# Patient Record
Sex: Female | Born: 1970 | Race: Black or African American | Hispanic: No | Marital: Single | Smoking: Never smoker
Health system: Southern US, Community
[De-identification: ages and names within clinical notes are randomized; demographics above are authoritative.]

## PROBLEM LIST (undated history)

## (undated) DIAGNOSIS — E785 Hyperlipidemia, unspecified: Secondary | ICD-10-CM

## (undated) HISTORY — PX: BREAST CYST ASPIRATION: SHX578

## (undated) HISTORY — DX: Hyperlipidemia, unspecified: E78.5

---
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
## 2004-09-19 ENCOUNTER — Emergency Department: Payer: Self-pay | Admitting: Emergency Medicine

## 2005-07-01 ENCOUNTER — Emergency Department: Payer: Self-pay | Admitting: Emergency Medicine

## 2006-02-03 ENCOUNTER — Emergency Department: Payer: Self-pay | Admitting: Emergency Medicine

## 2006-03-07 ENCOUNTER — Ambulatory Visit: Payer: Self-pay

## 2006-08-06 ENCOUNTER — Emergency Department: Payer: Self-pay | Admitting: Unknown Physician Specialty

## 2006-12-24 ENCOUNTER — Emergency Department: Payer: Self-pay | Admitting: Unknown Physician Specialty

## 2007-02-07 ENCOUNTER — Emergency Department: Payer: Self-pay | Admitting: Emergency Medicine

## 2007-12-06 HISTORY — PX: FOOT SURGERY: SHX648

## 2008-03-10 ENCOUNTER — Emergency Department: Payer: Self-pay | Admitting: Emergency Medicine

## 2008-07-25 ENCOUNTER — Emergency Department: Payer: Self-pay | Admitting: Emergency Medicine

## 2009-12-05 HISTORY — PX: WISDOM TOOTH EXTRACTION: SHX21

## 2009-12-23 ENCOUNTER — Emergency Department: Payer: Self-pay | Admitting: Emergency Medicine

## 2010-01-11 ENCOUNTER — Emergency Department: Payer: Self-pay | Admitting: Emergency Medicine

## 2010-01-13 ENCOUNTER — Emergency Department: Payer: Self-pay | Admitting: Emergency Medicine

## 2011-01-12 ENCOUNTER — Emergency Department: Payer: Self-pay | Admitting: Emergency Medicine

## 2011-06-11 ENCOUNTER — Emergency Department: Payer: Self-pay | Admitting: Emergency Medicine

## 2011-08-10 LAB — HM HIV SCREENING LAB: HM HIV Screening: NEGATIVE

## 2014-09-04 ENCOUNTER — Ambulatory Visit: Payer: Self-pay | Admitting: Family Medicine

## 2014-09-09 ENCOUNTER — Encounter: Payer: Self-pay | Admitting: General Surgery

## 2014-09-09 ENCOUNTER — Other Ambulatory Visit: Payer: BC Managed Care – PPO

## 2014-09-09 ENCOUNTER — Ambulatory Visit (INDEPENDENT_AMBULATORY_CARE_PROVIDER_SITE_OTHER): Payer: BC Managed Care – PPO | Admitting: General Surgery

## 2014-09-09 VITALS — BP 144/72 | HR 76 | Resp 12 | Ht 63.0 in | Wt 166.0 lb

## 2014-09-09 DIAGNOSIS — N6310 Unspecified lump in the right breast, unspecified quadrant: Secondary | ICD-10-CM

## 2014-09-09 DIAGNOSIS — N63 Unspecified lump in breast: Secondary | ICD-10-CM

## 2014-09-09 DIAGNOSIS — N6009 Solitary cyst of unspecified breast: Secondary | ICD-10-CM | POA: Insufficient documentation

## 2014-09-09 DIAGNOSIS — N6001 Solitary cyst of right breast: Secondary | ICD-10-CM

## 2014-09-09 NOTE — Progress Notes (Addendum)
Patient ID: Cynthia Woods Mckenny, female   DOB: 08/16/1971, 43 y.o.   MRN: [id number]  Chief Complaint  Patient presents with  . Other    right breast mass    HPI Cynthia Woods Mastel is a 43 y.o. female who presents for an evaluation of a right breast mass. Her most recent mammogram was done on 09/04/14. The patient states she noticed the lump last Monday 09/01/14,  having noted some soreness in the right breast approximately at the 11 o'clock position. She states the area was hard to the touch, and tender with direct pressure. She had not experienced any difficulty with perimenstrual pain or swelling in the past (the nodular area was appreciated on the last day of her menses and described as being the size of a ping-pong ball.  She does not perform self breast checks. Her recent mammogram was her first.   No personal or family history of breast problems. No injuries to the breast.   HPI  No past medical history on file.  Past Surgical History  Procedure Laterality Date  . Foot surgery  2009  . Wisdom tooth extraction  2011  . Gynecologic cryosurgery  1993    Family History  Problem Relation Age of Onset  . Other Mother     brain tumor  . Cancer Paternal Grandfather     colon  . Other Maternal Aunt     brain tumor  . Other Maternal Grandmother     brain tumor    Social History History  Substance Use Topics  . Smoking status: Never Smoker   . Smokeless tobacco: Never Used  . Alcohol Use: Yes    No Known Allergies  Current Outpatient Prescriptions  Medication Sig Dispense Refill  . VIORELE 0.15-0.02/0.01 MG (21/5) tablet Take 1 tablet by mouth daily.        No current facility-administered medications for this visit.    Review of Systems Review of Systems  Constitutional: Negative.   Respiratory: Negative.   Cardiovascular: Negative.     Blood pressure 144/72, pulse 76, resp. rate 12, height 5\' 3"  (1.6 m), weight 166 lb (75.297 kg), last menstrual period  08/30/2014.  Physical Exam Physical Exam  Constitutional: She is oriented to person, place, and time. She appears well-developed and well-nourished.  Neck: Neck supple. No thyromegaly present.  Cardiovascular: Normal rate, regular rhythm and normal heart sounds.   No murmur heard. Pulmonary/Chest: Effort normal and breath sounds normal. Right breast exhibits no inverted nipple, no mass, no nipple discharge, no skin change and no tenderness. Left breast exhibits no inverted nipple, no mass, no nipple discharge, no skin change and no tenderness.    Slight thickening upper outer quadrant.  Lymphadenopathy:    She has no cervical adenopathy.    She has no axillary adenopathy.  Neurological: She is alert and oriented to person, place, and time.  Skin: Skin is warm and dry.    Data Reviewed PCP notes of 09/02/2014 where a 3 x 4 cm breast mass is described.   Bilateral diagnostic mammograms and ultrasound dated 09/04/2014 were reviewed. No mammographic normality is appreciated in either breast.  Ultrasound of the upper outer quadrant of the right breast at the area of palpable thickening identified a 1 x 1 x 0.8 cm cystic lesion with minimal multiple internal layering debris but no solid component. Screening mammogram in one year recommended. BI-RAD-2.  Ultrasound examination of the right breast 9 to 12:00 position showed multiple cysts, largest at the  9:00 position (images mislabeled as three o'clock) 2 cm from nipple measuring 0.6 x 1.4 x 1.4 cm. At the 11:00 position multiple small cysts measuring up to 0.7 cm were noted. At the 10:00 position a simple cyst measuring 0.5 x 0.7 x 0.9 cm is identified. At the 10:00 position, 7 cm from nipple in the area of palpable thickening a simple cyst measuring less than 0.5 cm is noted as well as the index lesion measuring 0.7 x 1.06 x 1.07 cm. This has a hypoechoic heterogeneous central area and suggestion of a thickened rim.BIRAD 3.   The patient was  amenable to aspiration which was completed using 1 cc of 1% plain Xylocaine. 1 cc of purulent material was obtained, culture sent for aerobic organisms. The cystic component resolved but the hypoechoic rim persisted suggestive of an inflammatory process. The procedure was well tolerated.  Assessment    Traumatic versus infected right breast cyst.    Plan    The patient is essentially asymptomatic at this time, reporting some decrease in size prior to today's evaluation. Considering the prolonged appearance, culture was obtained. She denies any nipple or contact. No history of trauma. We'll plan for reassessment in 4 weeks to confirm complete resolution. If her examination is negative at that time, annual screening mammograms and yearly PCP exam to be appropriate      PCP/Ref MD: Dr. Margo Aye, Merrily Pew 09/09/2014, 10:58 AM

## 2014-09-09 NOTE — Patient Instructions (Signed)
Patient to return in 1 month for follow up. Continue self breast exams. Call office for any new breast issues or concerns.  

## 2014-09-11 LAB — AEROBIC CULTURE

## 2014-09-15 ENCOUNTER — Telehealth: Payer: Self-pay | Admitting: *Deleted

## 2014-09-15 NOTE — Telephone Encounter (Signed)
Pt was just calling to get her results on her culture that she has on 09/09/14

## 2014-09-16 NOTE — Telephone Encounter (Signed)
The patient was notified of the culture results were negative. She reports tolerating the aspiration well. She has not appreciated any recurrence of the breast nodule. Plans are for followup in 3-to 4 weeks for final assessment.

## 2014-10-06 ENCOUNTER — Encounter: Payer: Self-pay | Admitting: General Surgery

## 2014-10-14 ENCOUNTER — Ambulatory Visit: Payer: BC Managed Care – PPO | Admitting: General Surgery

## 2014-10-23 ENCOUNTER — Encounter: Payer: Self-pay | Admitting: *Deleted

## 2015-05-18 LAB — HM PAP SMEAR
HM PAP: NEGATIVE
HM Pap smear: NEGATIVE

## 2017-06-23 ENCOUNTER — Other Ambulatory Visit: Payer: Self-pay | Admitting: Orthopedic Surgery

## 2017-06-23 DIAGNOSIS — M5412 Radiculopathy, cervical region: Secondary | ICD-10-CM

## 2017-06-23 DIAGNOSIS — S161XXD Strain of muscle, fascia and tendon at neck level, subsequent encounter: Secondary | ICD-10-CM

## 2017-06-23 DIAGNOSIS — S143XXA Injury of brachial plexus, initial encounter: Secondary | ICD-10-CM

## 2017-07-07 ENCOUNTER — Other Ambulatory Visit: Payer: Self-pay

## 2017-07-20 DIAGNOSIS — M5412 Radiculopathy, cervical region: Secondary | ICD-10-CM | POA: Insufficient documentation

## 2017-09-20 ENCOUNTER — Telehealth: Payer: Self-pay | Admitting: Family Medicine

## 2017-09-20 NOTE — Telephone Encounter (Signed)
ERRENOUS °

## 2017-09-27 ENCOUNTER — Encounter: Payer: Self-pay | Admitting: Family Medicine

## 2017-09-27 ENCOUNTER — Ambulatory Visit (INDEPENDENT_AMBULATORY_CARE_PROVIDER_SITE_OTHER): Payer: Self-pay | Admitting: Family Medicine

## 2017-09-27 VITALS — BP 110/70 | HR 86 | Resp 14 | Ht 63.0 in | Wt 183.3 lb

## 2017-09-27 DIAGNOSIS — R768 Other specified abnormal immunological findings in serum: Secondary | ICD-10-CM

## 2017-09-27 DIAGNOSIS — R5383 Other fatigue: Secondary | ICD-10-CM

## 2017-09-27 DIAGNOSIS — E559 Vitamin D deficiency, unspecified: Secondary | ICD-10-CM

## 2017-09-27 NOTE — Progress Notes (Signed)
Name: Cynthia Woods   MRN: [id number]    DOB: 07-05-1971   Date:09/27/2017       Progress Note  Subjective  Chief Complaint  Chief Complaint  Patient presents with  . Advice Only    feels down.    HPI  Fatigue/positive hepatitis C screen: she was under [employer] for 3 weeks back in August and while donating plasma she had a positive hepatitis C test. She kept it all to herself for months but this week she decided to tell her sister, her supervisor and her children. She states she has not been depressed, but last week she started to get very worried, feeling better since she talked to her children. She is scared and wants to be treated, but will only have insurance in January. She states she can have labs done since it is free through [facility].    Patient Active Problem List   Diagnosis Date Noted  . Cervical radiculopathy 07/20/2017  . Breast mass, right 09/09/2014  . Benign breast cyst in female 09/09/2014    Past Surgical History:  Procedure Laterality Date  . FOOT SURGERY  2009  . GYNECOLOGIC CRYOSURGERY  1993  . WISDOM TOOTH EXTRACTION  2011    Family History  Problem Relation Age of Onset  . Other Mother        brain tumor  . Cancer Paternal Grandfather        colon  . Other Maternal Aunt        brain tumor  . Other Maternal Grandmother        brain tumor    Social History   Social History  . Marital status: Single    Spouse name: N/A  . Number of children: N/A  . Years of education: N/A   Occupational History  . Not on file.   Social History Main Topics  . Smoking status: Never Smoker  . Smokeless tobacco: Never Used  . Alcohol use Yes  . Drug use: No  . Sexual activity: Not Currently   Other Topics Concern  . Not on file   Social History Narrative   She works at [employer]   She has 3 children at home   She was on [employer] for 3 weeks in August 2018, back to work full time now     Current Outpatient Prescriptions:  Marland Kitchen   VIORELE 0.15-0.02/0.01 MG (21/5) tablet, Take 1 tablet by mouth daily. , Disp: , Rfl:   Allergies  Allergen Reactions  . Naproxen Diarrhea     ROS  Constitutional: Negative for fever or weight change.  Respiratory: Negative for cough and shortness of breath.   Cardiovascular: Negative for chest pain or palpitations.  Gastrointestinal: Negative for abdominal pain, no bowel changes.  Musculoskeletal: Negative for gait problem or joint swelling. She has right shoulder pain and some knee pain - chronic Skin: Negative for rash.  Neurological: Negative for dizziness or headache.  No other specific complaints in a complete review of systems (except as listed in HPI above).   Objective  Vitals:   09/27/17 0750  BP: 110/70  Pulse: 86  Resp: 14  SpO2: 98%  Weight: 183 lb 4.8 oz (83.1 kg)  Height: 5\' 3"  (1.6 m)    Body mass index is 32.47 kg/m.  Physical Exam  Constitutional: Patient appears well-developed and well-nourished. Obese No distress.  HEENT: head atraumatic, normocephalic, pupils equal and reactive to light,  neck supple, throat within normal limits Cardiovascular: Normal  rate, regular rhythm and normal heart sounds.  No murmur heard. No BLE edema. Pulmonary/Chest: Effort normal and breath sounds normal. No respiratory distress. Abdominal: Soft.  There is no tenderness. Normal bowel sounds, no masses or hepatomegaly  Psychiatric: Patient has a normal mood and affect. behavior is normal. Judgment and thought content normal.  PHQ2/9: Depression screen PHQ 2/9 09/27/2017  Decreased Interest 0  Down, Depressed, Hopeless 0  PHQ - 2 Score 0     Fall Risk: Fall Risk  09/27/2017 09/27/2017  Falls in the past year? Yes No  Number falls in past yr: 1 -  Injury with Fall? Yes -  Follow up Education provided -     Assessment & Plan  1. Other fatigue  - CBC with Differential/Platelet - Comprehensive metabolic panel - Vitamin B12 - VITAMIN D 25 Hydroxy (Vit-D  Deficiency, Fractures) - TSH - Lipid panel - Hemoglobin A1c - Hepatitis, Acute - Hepatitis c antibody (reflex)  2. Hepatitis C antibody test positive  - CBC with Differential/Platelet - Comprehensive metabolic panel - Vitamin B12 - VITAMIN D 25 Hydroxy (Vit-D Deficiency, Fractures) - TSH - Lipid panel - Hemoglobin A1c - Hepatitis, Acute - Hepatitis c antibody (reflex)

## 2017-09-27 NOTE — Patient Instructions (Addendum)
Hepatitis C Hepatitis C is a viral infection of the liver. It can lead to scarring of the liver (cirrhosis), liver failure, or liver cancer. Hepatitis C may go undetected for months or years because people with the infection may not have symptoms, or they may have only mild symptoms. What are the causes? Hepatitis C is caused by the hepatitis C virus (HCV). The virus can be passed from one person to another through:  Blood.  Contaminated needles, such as those used for tattooing, body piercing, acupuncture, or injecting drugs.  Having unprotected sex with an infected person.  Childbirth.  Blood transfusions or organ transplants done in the [place] before 1992.  What increases the risk? Risk factors for hepatitis C include:  Having unprotected sex with an infected person.  Using illegal drugs.  What are the signs or symptoms? Symptoms of hepatitis C may include:  Fatigue.  Loss of appetite.  Nausea.  Vomiting.  Abdominal pain.  Dark yellow urine.  Yellowish skin and eyes (jaundice).  Itching of the skin.  Clay-colored bowel movements.  Joint pain.  Symptoms are not always present. How is this diagnosed? Hepatitis C is diagnosed with blood tests. Other types of tests may also be done to check how your liver is functioning. How is this treated? Your health care provider may perform noninvasive tests or a liver biopsy to help determine the best course of treatment. Treatment for hepatitis C may include one or more medicines. Your health care provider may check you for a recurring infection or other liver conditions every 6-12 months after treatment. Follow these instructions at home:  Rest as needed.  Take all medicines as directed by your health care provider.  Do not take any medicine unless approved by your health care provider. This includes over-the-counter medicine and birth control pills.  Do not drink alcohol.  Do not have sex until approved by  your health care provider.  Do not share toothbrushes, nail clippers, razors, or needles. How is this prevented? There is no vaccine for hepatitis C. The only way to prevent the disease is to reduce the risk of exposure to the virus. This may be done by:  Practicing safe sex and using condoms.  Avoiding illegal drugs.  Contact a health care provider if:  You have a fever.  You develop abdominal pain.  You develop dark urine.  You have clay-colored bowel movements.  You develop joint pains. Get help right away if:  You have increasing fatigue or weakness.  You lose your appetite.  You feel nauseous or vomit.  You develop jaundice or your jaundice gets worse.  You bruise or bleed easily. This information is not intended to replace advice given to you by your health care provider. Make sure you discuss any questions you have with your health care provider. Document Released: 11/18/2000 Document Revised: 04/28/2016 Document Reviewed: 03/05/2014 Elsevier Interactive Patient Education  2017 [employer]  Check CDC website

## 2017-09-29 ENCOUNTER — Other Ambulatory Visit: Payer: Self-pay | Admitting: Family Medicine

## 2017-09-29 DIAGNOSIS — R768 Other specified abnormal immunological findings in serum: Secondary | ICD-10-CM

## 2017-09-29 LAB — CBC WITH DIFFERENTIAL/PLATELET
BASOS ABS: 0 10*3/uL (ref 0.0–0.2)
Basos: 0 %
EOS (ABSOLUTE): 0 10*3/uL (ref 0.0–0.4)
Eos: 0 %
Hematocrit: 38.7 % (ref 34.0–46.6)
Hemoglobin: 12.9 g/dL (ref 11.1–15.9)
IMMATURE GRANULOCYTES: 0 %
Immature Grans (Abs): 0 10*3/uL (ref 0.0–0.1)
Lymphocytes Absolute: 1.1 10*3/uL (ref 0.7–3.1)
Lymphs: 13 %
MCH: 30.6 pg (ref 26.6–33.0)
MCHC: 33.3 g/dL (ref 31.5–35.7)
MCV: 92 fL (ref 79–97)
MONOS ABS: 0.4 10*3/uL (ref 0.1–0.9)
Monocytes: 5 %
NEUTROS PCT: 82 %
Neutrophils Absolute: 7.1 10*3/uL — ABNORMAL HIGH (ref 1.4–7.0)
PLATELETS: 250 10*3/uL (ref 150–379)
RBC: 4.21 x10E6/uL (ref 3.77–5.28)
RDW: 13.9 % (ref 12.3–15.4)
WBC: 8.7 10*3/uL (ref 3.4–10.8)

## 2017-09-29 LAB — LIPID PANEL
CHOL/HDL RATIO: 3.3 ratio (ref 0.0–4.4)
CHOLESTEROL TOTAL: 260 mg/dL — AB (ref 100–199)
HDL: 79 mg/dL (ref >39)
LDL CALC: 163 mg/dL — AB (ref 0–99)
Triglycerides: 90 mg/dL (ref 0–149)
VLDL Cholesterol Cal: 18 mg/dL (ref 5–40)

## 2017-09-29 LAB — VITAMIN B12: VITAMIN B 12: 512 pg/mL (ref 232–1245)

## 2017-09-29 LAB — HEPATITIS PANEL, ACUTE
HEP A IGM: NEGATIVE
Hep B C IgM: NEGATIVE
Hep C Virus Ab: <0.1 s/co ratio (ref 0.0–0.9)
Hepatitis B Surface Ag: NEGATIVE

## 2017-09-29 LAB — COMPREHENSIVE METABOLIC PANEL
ALK PHOS: 53 IU/L (ref 39–117)
ALT: 12 IU/L (ref 0–32)
AST: 17 IU/L (ref 0–40)
Albumin/Globulin Ratio: 1.5 (ref 1.2–2.2)
Albumin: 4 g/dL (ref 3.5–5.5)
BUN/Creatinine Ratio: 17 (ref 9–23)
BUN: 13 mg/dL (ref 6–24)
Bilirubin Total: <0.2 mg/dL (ref 0.0–1.2)
CALCIUM: 9.2 mg/dL (ref 8.7–10.2)
CO2: 19 mmol/L — AB (ref 20–29)
CREATININE: 0.78 mg/dL (ref 0.57–1.00)
Chloride: 107 mmol/L — ABNORMAL HIGH (ref 96–106)
GFR calc Af Amer: 107 mL/min/{1.73_m2} (ref >59)
GFR, EST NON AFRICAN AMERICAN: 93 mL/min/{1.73_m2} (ref >59)
Globulin, Total: 2.6 g/dL (ref 1.5–4.5)
Glucose: 117 mg/dL — ABNORMAL HIGH (ref 65–99)
POTASSIUM: 4.7 mmol/L (ref 3.5–5.2)
Sodium: 142 mmol/L (ref 134–144)
Total Protein: 6.6 g/dL (ref 6.0–8.5)

## 2017-09-29 LAB — VITAMIN D 25 HYDROXY (VIT D DEFICIENCY, FRACTURES): VIT D 25 HYDROXY: 11.2 ng/mL — AB (ref 30.0–100.0)

## 2017-09-29 LAB — HEMOGLOBIN A1C
ESTIMATED AVERAGE GLUCOSE: 105 mg/dL
HEMOGLOBIN A1C: 5.3 % (ref 4.8–5.6)

## 2017-09-29 LAB — TSH: TSH: 0.62 u[IU]/mL (ref 0.450–4.500)

## 2017-09-29 MED ORDER — VITAMIN D (ERGOCALCIFEROL) 1.25 MG (50000 UNIT) PO CAPS: 50000.0000 [IU] | ORAL_CAPSULE | ORAL | 0 refills | Status: DC

## 2017-09-29 NOTE — Addendum Note (Signed)
Addended by: Cynda FamiliaJOHNSON, Shadrach Bartunek L on: 09/29/2017 03:52 PM   Modules accepted: Orders

## 2017-10-12 ENCOUNTER — Ambulatory Visit: Payer: Self-pay | Admitting: Family Medicine

## 2017-10-24 ENCOUNTER — Ambulatory Visit: Payer: Self-pay | Admitting: Family Medicine

## 2017-11-10 ENCOUNTER — Ambulatory Visit: Payer: Self-pay | Admitting: Family Medicine

## 2017-12-19 ENCOUNTER — Other Ambulatory Visit: Payer: Self-pay | Admitting: Family Medicine

## 2017-12-19 DIAGNOSIS — E559 Vitamin D deficiency, unspecified: Secondary | ICD-10-CM

## 2017-12-19 NOTE — Telephone Encounter (Signed)
Refill request for general medication: Vitamin D 50,000 units  Last office visit: 09/27/2017  Last physical exam: None   Follow-up on file. 12/28/2017

## 2017-12-28 ENCOUNTER — Ambulatory Visit: Payer: Self-pay | Admitting: Family Medicine

## 2018-02-09 ENCOUNTER — Ambulatory Visit: Payer: Self-pay | Admitting: Family Medicine

## 2018-03-24 ENCOUNTER — Other Ambulatory Visit: Payer: Self-pay | Admitting: Family Medicine

## 2018-03-24 DIAGNOSIS — E559 Vitamin D deficiency, unspecified: Secondary | ICD-10-CM

## 2018-05-02 DIAGNOSIS — M25511 Pain in right shoulder: Secondary | ICD-10-CM | POA: Insufficient documentation

## 2018-07-03 ENCOUNTER — Encounter: Payer: Self-pay | Admitting: Family Medicine

## 2018-07-03 ENCOUNTER — Ambulatory Visit (INDEPENDENT_AMBULATORY_CARE_PROVIDER_SITE_OTHER): Payer: BLUE CROSS/BLUE SHIELD | Admitting: Family Medicine

## 2018-07-03 VITALS — BP 108/64 | HR 99 | Temp 98.8°F | Resp 18 | Ht 63.0 in | Wt 181.8 lb

## 2018-07-03 DIAGNOSIS — G44229 Chronic tension-type headache, not intractable: Secondary | ICD-10-CM | POA: Insufficient documentation

## 2018-07-03 DIAGNOSIS — G43009 Migraine without aura, not intractable, without status migrainosus: Secondary | ICD-10-CM | POA: Insufficient documentation

## 2018-07-03 MED ORDER — SUMATRIPTAN SUCCINATE 100 MG PO TABS: 100.0000 mg | ORAL_TABLET | ORAL | 0 refills | Status: DC | PRN

## 2018-07-03 MED ORDER — TOPIRAMATE 50 MG PO TABS: 25.0000 mg | ORAL_TABLET | Freq: Two times a day (BID) | ORAL | 0 refills | Status: DC

## 2018-07-03 NOTE — Patient Instructions (Addendum)
Migraine Headache A migraine headache is an intense, throbbing pain on one side or both sides of the head. Migraines may also cause other symptoms, such as nausea, vomiting, and sensitivity to light and noise. What are the causes? Doing or taking certain things may also trigger migraines, such as:  Alcohol.  Smoking.  Medicines, such as: ? Medicine used to treat chest pain (nitroglycerine). ? Birth control pills. ? Estrogen pills. ? Certain blood pressure medicines.  Aged cheeses, chocolate, or caffeine.  Foods or drinks that contain nitrates, glutamate, aspartame, or tyramine.  Physical activity.  Other things that may trigger a migraine include:  Menstruation.  Pregnancy.  Hunger.  Stress, lack of sleep, too much sleep, or fatigue.  Weather changes.  What increases the risk? The following factors may make you more likely to experience migraine headaches:  Age. Risk increases with age.  Family history of migraine headaches.  Being Caucasian.  Depression and anxiety.  Obesity.  Being a woman.  Having a hole in the heart (patent foramen ovale) or other heart problems.  What are the signs or symptoms? The main symptom of this condition is pulsating or throbbing pain. Pain may:  Happen in any area of the head, such as on one side or both sides.  Interfere with daily activities.  Get worse with physical activity.  Get worse with exposure to bright lights or loud noises.  Other symptoms may include:  Nausea.  Vomiting.  Dizziness.  General sensitivity to bright lights, loud noises, or smells.  Before you get a migraine, you may get warning signs that a migraine is developing (aura). An aura may include:  Seeing flashing lights or having blind spots.  Seeing bright spots, halos, or zigzag lines.  Having tunnel vision or blurred vision.  Having numbness or a tingling feeling.  Having trouble talking.  Having muscle weakness.  How is this  diagnosed? A migraine headache can be diagnosed based on:  Your symptoms.  A physical exam.  Tests, such as CT scan or MRI of the head. These imaging tests can help rule out other causes of headaches.  Taking fluid from the spine (lumbar puncture) and analyzing it (cerebrospinal fluid analysis, or CSF analysis).  How is this treated? A migraine headache is usually treated with medicines that:  Relieve pain.  Relieve nausea.  Prevent migraines from coming back.  Treatment may also include:  Acupuncture.  Lifestyle changes like avoiding foods that trigger migraines.  Follow these instructions at home: Medicines  Take over-the-counter and prescription medicines only as told by your health care provider.  Do not drive or use heavy machinery while taking prescription pain medicine.  To prevent or treat constipation while you are taking prescription pain medicine, your health care provider may recommend that you: ? Drink enough fluid to keep your urine clear or pale yellow. ? Take over-the-counter or prescription medicines. ? Eat foods that are high in fiber, such as fresh fruits and vegetables, whole grains, and beans. ? Limit foods that are high in fat and processed sugars, such as fried and sweet foods. Lifestyle  Avoid alcohol use.  Do not use any products that contain nicotine or tobacco, such as cigarettes and e-cigarettes. If you need help quitting, ask your health care provider.  Get at least 8 hours of sleep every night.  Limit your stress. General instructions   Keep a journal to find out what may trigger your migraine headaches. For example, write down: ? What you eat and   drink. ? How much sleep you get. ? Any change to your diet or medicines.  If you have a migraine: ? Avoid things that make your symptoms worse, such as bright lights. ? It may help to lie down in a dark, quiet room. ? Do not drive or use heavy machinery. ? Ask your health care provider  what activities are safe for you while you are experiencing symptoms.  Keep all follow-up visits as told by your health care provider. This is important. Contact a health care provider if:  You develop symptoms that are different or more severe than your usual migraine symptoms. Get help right away if:  Your migraine becomes severe.  You have a fever.  You have a stiff neck.  You have vision loss.  Your muscles feel weak or like you cannot control them.  You start to lose your balance often.  You develop trouble walking.  You faint. This information is not intended to replace advice given to you by your health care provider. Make sure you discuss any questions you have with your health care provider. Document Released: 11/21/2005 Document Revised: 06/10/2016 Document Reviewed: 05/09/2016 Elsevier Interactive Patient Education  2017 [employer] Inc.   Check complex regional syndrome

## 2018-07-03 NOTE — Progress Notes (Addendum)
Name: Cynthia SchillerKarrie Leshan Clear   MRN: [id number]    DOB: 12/09/1970   Date:07/03/2018       Progress Note  Subjective  Chief Complaint  Chief Complaint  Patient presents with  . Headache    Has had headaches for year but the frequency is getting worst-having them every other day. Headaches can last all day-has to take BC's or 4 Advil's to just relieve the pressure. Starts in temples and radiates down neck. Noise sensitive.    HPI  Migraine and chronic headaches: she sates that she has a long history of headache and migraines, however over the past month she has been having headaches about 4 times a week and full blown migraine about twice this month, other times she had to take medication and has to go to sleep. She is on ocp, but denies aura. She is not taking preventive medication. She states tension headache is usually temporal , dull ache, sometimes radiates to her neck, other times throbbing, intense and only resolved with BC and sleep. Associate with nausea but not vomiting.    Patient Active Problem List   Diagnosis Date Noted  . Tension headache, chronic 07/03/2018  . Migraine without aura and without status migrainosus, not intractable 07/03/2018  . Shoulder pain, right 05/02/2018  . Cervical radiculopathy 07/20/2017  . Breast mass, right 09/09/2014  . Benign breast cyst in female 09/09/2014    Past Surgical History:  Procedure Laterality Date  . FOOT SURGERY  2009  . GYNECOLOGIC CRYOSURGERY  1993  . WISDOM TOOTH EXTRACTION  2011    Family History  Problem Relation Age of Onset  . Other Mother        brain tumor  . Cancer Paternal Grandfather        colon  . Other Maternal Aunt        brain tumor  . Other Maternal Grandmother        brain tumor    Social History   Socioeconomic History  . Marital status: Single    Spouse name: Not on file  . Number of children: 3  . Years of education: Not on file  . Highest education level: Not on file  Occupational History   . Not on file  Social Needs  . Financial resource strain: Hard  . Food insecurity:    Worry: Often true    Inability: Never true  . Transportation needs:    Medical: No    Non-medical: No  Tobacco Use  . Smoking status: Never Smoker  . Smokeless tobacco: Never Used  Substance and Sexual Activity  . Alcohol use: Yes  . Drug use: No  . Sexual activity: Not Currently  Lifestyle  . Physical activity:    Days per week: 0 days    Minutes per session: 0 min  . Stress: To some extent  Relationships  . Social connections:    Talks on phone: Not on file    Gets together: Not on file    Attends religious service: Not on file    Active member of club or organization: Not on file    Attends meetings of clubs or organizations: Not on file    Relationship status: Not on file  . Intimate partner violence:    Fear of current or ex partner: No    Emotionally abused: No    Physically abused: No    Forced sexual activity: No  Other Topics Concern  . Not on file  Social History Narrative  She works at [employer]   She has 3 children at home   She was on [employer] for 3 weeks in August 2018, back to work full time now     Current Outpatient Medications:  .  cholecalciferol (VITAMIN D) 1000 units tablet, Take 1,000 Units by mouth daily., Disp: , Rfl:  .  VIORELE 0.15-0.02/0.01 MG (21/5) tablet, Take 1 tablet by mouth daily. , Disp: , Rfl:  .  SUMAtriptan (IMITREX) 100 MG tablet, Take 1 tablet (100 mg total) by mouth every 2 (two) hours as needed for migraine. May repeat in 2 hours if headache persists or recurs., Disp: 10 tablet, Rfl: 0 .  topiramate (TOPAMAX) 50 MG tablet, Take 0.5-2 tablets (25-100 mg total) by mouth 2 (two) times daily., Disp: 60 tablet, Rfl: 0  Allergies  Allergen Reactions  . Shellfish Allergy Anaphylaxis  . Naproxen Diarrhea  . Latex Rash     ROS  Ten systems reviewed and is negative except as mentioned in HPI   Objective  Vitals:   07/03/18 1400   BP: 108/64  Pulse: 99  Resp: 18  Temp: 98.8 F (37.1 C)  TempSrc: Oral  SpO2: 96%  Weight: 181 lb 12.8 oz (82.5 kg)  Height: 5\' 3"  (1.6 m)    Body mass index is 32.2 kg/m.  Physical Exam  Constitutional: Patient appears well-developed and well-nourished. Obese  No distress.  HEENT: head atraumatic, normocephalic, pupils equal and reactive to light, eneck supple, throat within normal limits Cardiovascular: Normal rate, regular rhythm and normal heart sounds.  No murmur heard. No BLE edema. Pulmonary/Chest: Effort normal and breath sounds normal. No respiratory distress. Abdominal: Soft.  There is no tenderness. Psychiatric: Patient has a normal mood and affect. behavior is normal. Judgment and thought content normal. Neurological: she is weaker on the rigth arm ( grip ) secondary neck injury at work, otherwise normal exam   PHQ2/9: Depression screen [facility] 2/9 07/03/2018 09/27/2017 09/27/2017  Decreased Interest 0 0 0  Down, Depressed, Hopeless 0 1 0  PHQ - 2 Score 0 1 0  Altered sleeping - 0 -  Tired, decreased energy - 3 -  Change in appetite - 0 -  Feeling bad or failure about yourself  - 0 -  Trouble concentrating - 0 -  Moving slowly or fidgety/restless - 0 -  Suicidal thoughts - 0 -  PHQ-9 Score - 4 -  Difficult doing work/chores - Not difficult at all -    Fall Risk: Fall Risk  07/03/2018 09/27/2017 09/27/2017  Falls in the past year? Yes Yes No  Comment May 29, 2017 - -  Number falls in past yr: 1 1 -  Injury with Fall? Yes Yes -  Comment Right Arm - -  Follow up - Education provided -     Functional Status Survey: Is the patient deaf or have difficulty hearing?: No Does the patient have difficulty seeing, even when wearing glasses/contacts?: Yes(glasses) Does the patient have difficulty concentrating, remembering, or making decisions?: No Does the patient have difficulty walking or climbing stairs?: No Does the patient have difficulty dressing or  bathing?: No Does the patient have difficulty doing errands alone such as visiting a doctor's office or shopping?: No    Assessment & Plan  1. Migraine without aura and without status migrainosus, not intractable  Discussed need to titrate medication up slowly  - SUMAtriptan (IMITREX) 100 MG tablet; Take 1 tablet (100 mg total) by mouth every 2 (two) hours as needed  for migraine. May repeat in 2 hours if headache persists or recurs.  Dispense: 10 tablet; Refill: 0 - topiramate (TOPAMAX) 50 MG tablet; Take 0.5-2 tablets (25-100 mg total) by mouth 2 (two) times daily.  Dispense: 60 tablet; Refill: 0  2. Chronic tension-type headache, not intractable  We will start with topamax, she is under more stress than usual. Oldest son not helping at home, also still going through mediations with workman's' comp, struggling financially. She is not depressed but feels stressed.

## 2018-07-04 ENCOUNTER — Encounter: Payer: Self-pay | Admitting: Family Medicine

## 2018-07-05 ENCOUNTER — Telehealth: Payer: Self-pay | Admitting: Family Medicine

## 2018-07-05 NOTE — Telephone Encounter (Signed)
Copied from CRM [phone]. Topic: Quick Communication - See Telephone Encounter >> Jul 05, 2018 11:03 AM Tamela OddiMartin, Don'Quashia, NT wrote: CRM for notification. See Telephone encounter for: 07/05/18. Patient called and states she seen Dr. Carlynn PurlSowles and she prescribed her topiramate (TOPAMAX) 50 MG tablet . She states it said take 0.5-2 tablets (25-100 mg total) by mouth 2 (two) times daily. She is unsure if she needs to take 1 whole tablet. Please call CB# [phone]

## 2018-07-05 NOTE — Telephone Encounter (Signed)
Please review the instruction and advise

## 2018-07-06 NOTE — Telephone Encounter (Signed)
I tried to contact this patient to inform her of how to properly titrate her medications but there was no answer and she did not have a voicemail where I could leave a message.   I will route this information to the P[facility].

## 2018-07-06 NOTE — Telephone Encounter (Signed)
Go up slowly on dose, start on half for a few days and go up by half every 3 days to a max of 2 in am and 2 in pm

## 2018-07-06 NOTE — Telephone Encounter (Signed)
Pt given information per Dr Carlynn PurlSowles, Go up slowly on dose, start on half for a few days and go up by half every 3 days to a max of 2 in am and 2 in pm"; she verbalizes understanding; she will try this; pt instructed to call back if she has any further questions or difficulties.

## 2018-07-18 ENCOUNTER — Telehealth: Payer: Self-pay | Admitting: Family Medicine

## 2018-07-18 NOTE — Telephone Encounter (Signed)
Copied from CRM [id number]. Topic: Referral - Request >> Jul 18, 2018  4:24 PM Maia Pettiesrtiz, Kristie S wrote: Reason for CRM: pt requesting call back to discuss neck pain and migraines. She thinks the neck pain is causing headaches and asking for a referral and/or MRI. Please advise.

## 2018-07-19 NOTE — Telephone Encounter (Signed)
I called this patient to discuss the message that was left on yesterday but she told me to just disregard it and to tell Dr. Carlynn PurlSowles "hello"

## 2018-07-19 NOTE — Telephone Encounter (Signed)
There is no indication for MRI at this time, I can refer her to neurologist

## 2018-07-19 NOTE — Telephone Encounter (Signed)
Please review and sign order for neurology referral

## 2018-08-07 ENCOUNTER — Ambulatory Visit: Payer: BLUE CROSS/BLUE SHIELD | Admitting: Family Medicine

## 2019-03-28 ENCOUNTER — Other Ambulatory Visit: Payer: Self-pay

## 2019-03-28 ENCOUNTER — Encounter: Payer: Self-pay | Admitting: Emergency Medicine

## 2019-03-28 ENCOUNTER — Encounter: Payer: Self-pay | Admitting: Family Medicine

## 2019-03-28 ENCOUNTER — Ambulatory Visit: Payer: Medicaid Other | Admitting: Family Medicine

## 2019-03-28 VITALS — BP 110/72 | HR 77 | Temp 98.4°F | Resp 16 | Ht 64.0 in | Wt 164.4 lb

## 2019-03-28 DIAGNOSIS — M109 Gout, unspecified: Secondary | ICD-10-CM | POA: Diagnosis not present

## 2019-03-28 DIAGNOSIS — G43009 Migraine without aura, not intractable, without status migrainosus: Secondary | ICD-10-CM

## 2019-03-28 MED ORDER — COLCHICINE 0.6 MG PO TABS: 0.6000 mg | ORAL_TABLET | Freq: Every day | ORAL | 0 refills | Status: DC

## 2019-03-28 NOTE — Patient Instructions (Signed)

## 2019-03-28 NOTE — Progress Notes (Signed)
Name: Cynthia Woods   MRN: [id number]    DOB: 1971-06-25   Date:03/28/2019       Progress Note  Subjective  Chief Complaint  Chief Complaint  Patient presents with  . Foot Pain    great toe on left foot. 1st noticed yesterday - no known injury. cannot bear weight - can't move it, pulsates when she is up.    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 No history of gout, no significant in her diet, however activity at work has increased. Works as a Conservation officer, nature for [employer] and since they COVID-19 they are having to walk more around the store for online orders.   Migraine: no recent episodes , she came in July 2019 with severe episode at that time episodes were about 4 times a week, she was under more stress at [facility], we gave her topamax and imitrex. She never started topamax , she was afraid of side effects, but she takes imitrex prn, no recent episodes. Episodes are described as throbbing on her temporal areas and sometimes radiates to her nuchal area. Not associated with nausea or vomiting.   Patient Active Problem List   Diagnosis Date Noted  . Tension headache, chronic 07/03/2018  . Migraine without aura and without status migrainosus, not intractable 07/03/2018  . Shoulder pain, right 05/02/2018  . Cervical radiculopathy 07/20/2017  . Breast mass, right 09/09/2014  . Benign breast cyst in female 09/09/2014    Past Surgical History:  Procedure Laterality Date  . FOOT SURGERY  2009  . GYNECOLOGIC CRYOSURGERY  1993  . WISDOM TOOTH EXTRACTION  2011    Family History  Problem Relation Age of Onset  . Other Mother        brain tumor  . Cancer Paternal Grandfather        colon  . Other Maternal Aunt        brain tumor  . Other Maternal Grandmother        brain tumor    Social History   Socioeconomic History  . Marital status:  Single    Spouse name: Not on file  . Number of children: 3  . Years of education: Not on file  . Highest education level: Not on file  Occupational History    Comment: part   Social Needs  . Financial resource strain: Hard  . Food insecurity:    Worry: Often true    Inability: Never true  . Transportation needs:    Medical: No    Non-medical: No  Tobacco Use  . Smoking status: Never Smoker  . Smokeless tobacco: Never Used  Substance and Sexual Activity  . Alcohol use: Yes  . Drug use: No  . Sexual activity: Not Currently    Partners: Male  Lifestyle  . Physical activity:    Days per week: 0 days    Minutes per session: 0 min  . Stress: To some extent  Relationships  . Social connections:    Talks on phone: More than three times a week    Gets together: Twice a week    Attends religious service: More than 4 times per year    Active member of club or organization: Yes    Attends meetings of clubs or organizations: 1 to 4 times per year    Relationship status: Never married  .  Intimate partner violence:    Fear of current or ex partner: No    Emotionally abused: No    Physically abused: No    Forced sexual activity: No  Other Topics Concern  . Not on file  Social History Narrative   She used to work at [employer] but had to quit after she settled her [employer] comp claim in August 2019    She has 3 children at home     Current Outpatient Medications:  .  cholecalciferol (VITAMIN D) 1000 units tablet, Take 1,000 Units by mouth daily., Disp: , Rfl:  .  Multiple Vitamins-Minerals (WOMENS MULTIVITAMIN PO), Take by mouth., Disp: , Rfl:  .  SUMAtriptan (IMITREX) 100 MG tablet, Take 1 tablet (100 mg total) by mouth every 2 (two) hours as needed for migraine. May repeat in 2 hours if headache persists or recurs., Disp: 10 tablet, Rfl: 0 .  VIORELE 0.15-0.02/0.01 MG (21/5) tablet, Take 1 tablet by mouth daily. , Disp: , Rfl:  .  colchicine 0.6 MG tablet, Take 1-2 tablets  (0.6-1.2 mg total) by mouth daily. Take two now  and may take one more in 2 hours if not better, may take daily for 3 days and prn, Disp: 10 tablet, Rfl: 0  Allergies  Allergen Reactions  . Shellfish Allergy Anaphylaxis  . Naproxen Diarrhea  . Latex Rash    I personally reviewed active problem list, medication list, allergies, family history with the patient/caregiver today.   ROS  Ten systems reviewed and is negative except as mentioned in HPI   Objective  Vitals:   03/28/19 0954  BP: 110/72  Pulse: 77  Resp: 16  Temp: 98.4 F (36.9 C)  TempSrc: Oral  SpO2: 97%  Weight: 164 lb 6.4 oz (74.6 kg)  Height: 5\' 4"  (1.626 m)    Body mass index is 28.22 kg/m.  Physical Exam  Constitutional: Patient appears well-developed and well-nourished. Overweight.  No distress.  HEENT: head atraumatic, normocephalic, pupils equal and reactive to light,  neck supple, throat within normal limits Cardiovascular: Normal rate, regular rhythm and normal heart sounds.  No murmur heard. No BLE edema. Pulmonary/Chest: Effort normal and breath sounds normal. No respiratory distress. Abdominal: Soft.  There is no tenderness. Psychiatric: Patient has a normal mood and affect. behavior is normal. Judgment and thought content  normal. Muscular skeletal: left first MCP swollen, slightly red and very tender to touch, not bearing weight on the area.    PHQ2/9: Depression screen [facility] 2/9 03/28/2019 07/03/2018 09/27/2017 09/27/2017  Decreased Interest 0 0 0 0  Down, Depressed, Hopeless 0 0 1 0  PHQ - 2 Score 0 0 1 0  Altered sleeping 0 - 0 -  Tired, decreased energy 0 - 3 -  Change in appetite 0 - 0 -  Feeling bad or failure about yourself  0 - 0 -  Trouble concentrating 0 - 0 -  Moving slowly or fidgety/restless 0 - 0 -  Suicidal thoughts 0 - 0 -  PHQ-9 Score 0 - 4 -  Difficult doing work/chores Not difficult at all - Not difficult at all -    phq 9 is negative   Fall Risk: Fall Risk   03/28/2019 07/03/2018 09/27/2017 09/27/2017  Falls in the past year? 0 Yes Yes No  Comment - May 29, 2017 - -  Number falls in past yr: 0 1 1 -  Injury with Fall? 0 Yes Yes -  Comment - Right Arm - -  Follow  up - - Education provided -     Functional Status Survey: Is the patient deaf or have difficulty hearing?: No Does the patient have difficulty seeing, even when wearing glasses/contacts?: No Does the patient have difficulty concentrating, remembering, or making decisions?: No Does the patient have difficulty walking or climbing stairs?: No Does the patient have difficulty dressing or bathing?: No Does the patient have difficulty doing errands alone such as visiting a doctor's office or shopping?: No    Assessment & Plan  1. Podagra  Discussed possible side effects  - colchicine 0.6 MG tablet; Take 2 tablet (1.2 mg total) by mouth daily. One now and may repeat in 2 hours if not better, may take daily for 3 days and prn  Dispense: 10 tablet; Refill: 0  2. Migraine without aura and without status migrainosus, not intractable  Doing well at this time

## 2019-03-29 ENCOUNTER — Telehealth: Payer: Self-pay | Admitting: Family Medicine

## 2019-03-29 NOTE — Telephone Encounter (Signed)
Copied from CRM [id number]. Topic: Quick Communication - See Telephone Encounter >> Mar 29, 2019 12:39 PM Lorrine Kin, NT wrote: CRM for notification. See Telephone encounter for: 03/29/19. Patient calling and states that she just spoke with her pharmay regarding the colchicine 0.6 MG tablet. States that the pharmacy told her they have sent over 3 faxes stating that a PA is needing to be done on this medication. Please advise.

## 2019-03-29 NOTE — Telephone Encounter (Signed)
Spoke with pharmacist and Mitigare (branded colchicine 0.6mg ) Capsules is preferred with Medicaid. They were able to switched it and fill for the patient. [facility] been notified [facility].

## 2019-04-04 ENCOUNTER — Encounter: Payer: Self-pay | Admitting: Family Medicine

## 2019-04-08 ENCOUNTER — Encounter: Payer: Self-pay | Admitting: Family Medicine

## 2019-04-23 ENCOUNTER — Ambulatory Visit: Payer: Self-pay

## 2019-04-23 NOTE — Telephone Encounter (Signed)
Incoming  Call from Patient stating that she has been exposed to a  Coworker who will been tested for Covid-19.  rewviewd protocol with Patient  And Provided care advice with Patient.  Patient voiced understanding.   Reason for Disposition . [1] COVID-19 EXPOSURE AND [2] 15 or more days ago AND [3] NO cough or fever or breathing difficulty  Answer Assessment - Initial Assessment Questions 1. CLOSE CONTACT: "Who is the person with the confirmed or suspected COVID-19 infection that you were exposed to?"   coworker 2. PLACE of CONTACT: "Where were you when you were exposed to COVID-19?" (e.g., home, school, medical waiting room; which city?)     Work    3. TYPE of CONTACT: "How much contact was there?" (e.g., sitting next to, live in same house, work in same office, same building)     Same ware house 4. DURATION of CONTACT: "How long were you in contact with the COVID-19 patient?" (e.g., a few seconds, passed by person, a few minutes, live with the patient)      5. DATE of CONTACT: "When did you have contact with a COVID-19 patient?" (e.g., how many days ago)    May 16 6. TRAVEL: "Have you traveled out of the country recently?" If so, "When and where?"     * Also ask about out-of-state travel, since the CDC has identified some high risk cities for community spread in the [place]. no    * Note: Travel becomes less relevant if there is widespread community transmission where the patient lives.     **7. COMMUNITY SPREAD: "Are there lots of cases of COVID-19 (community spread) where you live?" (See public health department website, if unsure) one case   * MAJOR community spread: high number of cases; numbers of cases are increasing; many people hospitalized.   * MINOR community spread: low number of cases; not increasing; few or no people hospitalized     *No Answer* 8. SYMPTOMS: "Do you have any symptoms?" (e.g., fever, cough, breathing difficulty)     Sneezing  allerygy Sx. 9. PREGNANCY OR POSTPARTUM:  "Is there any chance you are pregnant?" "When was your last menstrual period?" "Did you deliver in the last 2 weeks?"     Last week 10. HIGH RISK: "Do you have any heart or lung problems? Do you have a weak immune system?" (e.g., CHF, COPD, asthma, HIV positive, chemotherapy, renal failure, diabetes mellitus, sickle cell anemia)       denies  Protocols used: CORONAVIRUS (COVID-19) EXPOSURE-A-AH

## 2019-06-13 ENCOUNTER — Ambulatory Visit: Payer: Medicaid Other | Admitting: Family Medicine

## 2019-08-13 ENCOUNTER — Telehealth: Payer: Self-pay | Admitting: General Practice

## 2019-08-13 NOTE — Telephone Encounter (Signed)
NEEDS REFILL ON BC PILLS ; [facility]ES [facility] [address]

## 2019-08-14 ENCOUNTER — Telehealth: Payer: Self-pay | Admitting: Physician Assistant

## 2019-08-14 NOTE — Telephone Encounter (Signed)
[facility]  Form completed and OKed 1, 90 day supply refill for Kar[facility].  Fax confirmation received .

## 2019-08-14 NOTE — Telephone Encounter (Signed)
Call to patient at home number.  Counseled patient that I sent OK for 3 month supply of her OCs to pharmacy as requested.

## 2019-09-13 ENCOUNTER — Other Ambulatory Visit: Payer: Self-pay

## 2019-09-13 ENCOUNTER — Ambulatory Visit (INDEPENDENT_AMBULATORY_CARE_PROVIDER_SITE_OTHER): Payer: Medicaid Other | Admitting: Family Medicine

## 2019-09-13 ENCOUNTER — Encounter: Payer: Self-pay | Admitting: Family Medicine

## 2019-09-13 VITALS — BP 116/68 | HR 98 | Temp 97.3°F | Resp 16 | Ht 64.0 in | Wt 162.7 lb

## 2019-09-13 DIAGNOSIS — R030 Elevated blood-pressure reading, without diagnosis of hypertension: Secondary | ICD-10-CM | POA: Diagnosis not present

## 2019-09-13 DIAGNOSIS — Z131 Encounter for screening for diabetes mellitus: Secondary | ICD-10-CM

## 2019-09-13 DIAGNOSIS — Z13220 Encounter for screening for lipoid disorders: Secondary | ICD-10-CM

## 2019-09-13 DIAGNOSIS — E559 Vitamin D deficiency, unspecified: Secondary | ICD-10-CM | POA: Diagnosis not present

## 2019-09-13 DIAGNOSIS — R42 Dizziness and giddiness: Secondary | ICD-10-CM | POA: Diagnosis not present

## 2019-09-13 NOTE — Progress Notes (Signed)
Name: Cynthia Woods   MRN: [id number]    DOB: Feb 09, 1971   Date:09/13/2019       Progress Note  Subjective  Chief Complaint  Chief Complaint  Patient presents with  . Dizziness  . Hypertension    Recent episode of high BP: patient states she was stressed, tired and had not eaten all day. BP was 138/90 and felt lightheaded on Tuesday    HPI  Dizziness: she states she was at work three days ago and developed dizziness, bp spikes to 138/90 and she was sent home. She states she was not very stressed that day but she did not eat until later. She has been fine since, no chest pain , palpitation or SOB. No fever or chills. She was upset today because got misunderstood at work but bp today is normal. Discussed labs but she does not have insurance. She also wants to hold off on EKG.    Patient Active Problem List   Diagnosis Date Noted  . Tension headache, chronic 07/03/2018  . Migraine without aura and without status migrainosus, not intractable 07/03/2018  . Shoulder pain, right 05/02/2018  . Cervical radiculopathy 07/20/2017  . Breast mass, right 09/09/2014  . Benign breast cyst in female 09/09/2014    Past Surgical History:  Procedure Laterality Date  . FOOT SURGERY  2009  . GYNECOLOGIC CRYOSURGERY  1993  . WISDOM TOOTH EXTRACTION  2011    Family History  Problem Relation Age of Onset  . Other Mother        brain tumor  . Cancer Paternal Grandfather        colon  . Other Maternal Aunt        brain tumor  . Other Maternal Grandmother        brain tumor    Social History   Socioeconomic History  . Marital status: Single    Spouse name: Not on file  . Number of children: 3  . Years of education: Not on file  . Highest education level: Not on file  Occupational History    Comment: part   Social Needs  . Financial resource strain: Hard  . Food insecurity    Worry: Often true    Inability: Never true  . Transportation needs    Medical: No    Non-medical: No   Tobacco Use  . Smoking status: Never Smoker  . Smokeless tobacco: Never Used  Substance and Sexual Activity  . Alcohol use: Yes  . Drug use: No  . Sexual activity: Not Currently    Partners: Male  Lifestyle  . Physical activity    Days per week: 0 days    Minutes per session: 0 min  . Stress: To some extent  Relationships  . Social connections    Talks on phone: More than three times a week    Gets together: Twice a week    Attends religious service: More than 4 times per year    Active member of club or organization: Yes    Attends meetings of clubs or organizations: 1 to 4 times per year    Relationship status: Never married  . Intimate partner violence    Fear of current or ex partner: No    Emotionally abused: No    Physically abused: No    Forced sexual activity: No  Other Topics Concern  . 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  cholecalciferol (VITAMIN D) 1000 units tablet, Take 1,000 Units by mouth daily., Disp: , Rfl:  .  Multiple Vitamins-Minerals (WOMENS MULTIVITAMIN PO), Take by mouth., Disp: , Rfl:  .  VIORELE 0.15-0.02/0.01 MG (21/5) tablet, Take 1 tablet by mouth daily. , Disp: , Rfl:  .  colchicine 0.6 MG tablet, Take 1-2 tablets (0.6-1.2 mg total) by mouth daily. Take two now  and may take one more in 2 hours if not better, may take daily for 3 days and prn (Patient not taking: Reported on 09/13/2019), Disp: 10 tablet, Rfl: 0 .  SUMAtriptan (IMITREX) 100 MG tablet, Take 1 tablet (100 mg total) by mouth every 2 (two) hours as needed for migraine. May repeat in 2 hours if headache persists or recurs. (Patient not taking: Reported on 09/13/2019), Disp: 10 tablet, Rfl: 0  Allergies  Allergen Reactions  . Shellfish Allergy  Anaphylaxis  . Naproxen Diarrhea  . Latex Rash    I personally reviewed active problem list, medication list, allergies, family history, social history, health maintenance with the patient/caregiver today.   ROS  Constitutional: Negative for fever or weight change.  Respiratory: Negative for cough and shortness of breath.   Cardiovascular: Negative for chest pain or palpitations.  Gastrointestinal: Negative for abdominal pain, no bowel changes.  Musculoskeletal: Negative for gait problem or joint swelling.  Skin: Negative for rash.  Neurological: positive  for dizziness or headache.  No other specific complaints in a complete review of systems (except as listed in HPI above).  Objective  Vitals:   09/13/19 1418  BP: 116/68  Pulse: 98  Resp: 16  Temp: (!) 97.3 F (36.3 C)  TempSrc: Temporal  SpO2: 98%  Weight: 162 lb 11.2 oz (73.8 kg)  Height: 5\' 4"  (1.626 m)    Body mass index is 27.93 kg/m.  Physical Exam  Constitutional: Patient appears well-developed and well-nourished. Overweight  No distress.  HEENT: head atraumatic, normocephalic, pupils equal and reactive to light Cardiovascular: Normal rate, regular rhythm and normal heart sounds.  No murmur heard. No BLE edema. Pulmonary/Chest: Effort normal and breath sounds normal. No respiratory distress. Abdominal: Soft.  There is no tenderness. Psychiatric: Patient has a normal mood and affect. behavior is normal. Judgment and thought content normal.  PHQ2/9: Depression screen [facility] 2/9 09/13/2019 03/28/2019 07/03/2018 09/27/2017 09/27/2017  Decreased Interest 0 0 0 0 0  Down, Depressed, Hopeless 0 0 0 1 0  PHQ - 2 Score 0 0 0 1 0  Altered sleeping 0 0 - 0 -  Tired, decreased energy 0 0 - 3 -  Change in appetite 0 0 - 0 -  Feeling bad or failure about yourself  0 0 - 0 -  Trouble concentrating 0 0 - 0 -  Moving slowly or fidgety/restless 0 0 - 0 -  Suicidal thoughts 0 0 - 0 -  PHQ-9 Score 0 0 - 4 -  Difficult doing  work/chores Not difficult at all Not difficult at all - Not difficult at all -    phq 9 is negative   Fall Risk: Fall Risk  09/13/2019 03/28/2019 07/03/2018 09/27/2017 09/27/2017  Falls in the past year? 0 0 Yes Yes No  Comment - - May 29, 2017 - -  Number falls in past yr: 0 0 1 1 -  Injury with Fall? 0 0 Yes Yes -  Comment - - Right Arm - -  Follow up - - - Education provided -     Functional Status Survey: Is the patient deaf or have difficulty hearing?: No Does the patient have difficulty seeing, even when wearing glasses/contacts?: No Does the patient have difficulty concentrating, remembering, or making decisions?: No Does the patient have difficulty walking or climbing stairs?: No Does the patient have difficulty dressing or bathing?: No Does the patient have difficulty doing errands alone such as visiting a doctor's office or shopping?: No    Assessment & Plan  1. Elevated BP without diagnosis of hypertension  - COMPLETE METABOLIC PANEL WITH GFR - CBC with Differential/Platelet - TSH  2. Dizziness  - COMPLETE METABOLIC PANEL WITH GFR - CBC with Differential/Platelet - TSH  3. Lipid screening  - Lipid panel  4. Diabetes mellitus screening  - Hemoglobin A1c  5. Vitamin D deficiency  - VITAMIN D 25 Hydroxy (Vit-D Deficiency, Fractures)

## 2019-09-19 ENCOUNTER — Encounter: Payer: Self-pay | Admitting: Family Medicine

## 2019-09-24 ENCOUNTER — Encounter: Payer: Self-pay | Admitting: Family Medicine

## 2019-09-30 ENCOUNTER — Ambulatory Visit: Payer: Self-pay

## 2019-09-30 ENCOUNTER — Ambulatory Visit (INDEPENDENT_AMBULATORY_CARE_PROVIDER_SITE_OTHER): Payer: Medicaid Other | Admitting: Family Medicine

## 2019-09-30 ENCOUNTER — Encounter: Payer: Self-pay | Admitting: Family Medicine

## 2019-09-30 ENCOUNTER — Other Ambulatory Visit: Payer: Self-pay

## 2019-09-30 VITALS — BP 112/68 | HR 77 | Temp 97.1°F | Resp 16 | Ht 64.0 in | Wt 163.0 lb

## 2019-09-30 DIAGNOSIS — N6315 Unspecified lump in the right breast, overlapping quadrants: Secondary | ICD-10-CM | POA: Diagnosis not present

## 2019-09-30 NOTE — Progress Notes (Signed)
Name: Cynthia Woods   MRN: [id number]    DOB: 10/07/1971   Date:09/30/2019       Progress Note  Subjective  Chief Complaint  Chief Complaint  Patient presents with  . Breast Mass    Found a spot on her right breast and wanted Dr. Carlynn PurlSowles to do a breast exam    HPI  Breast lump: she noticed a lump on right breast a couple of weeks ago. No pain or nipple discharge, skin looks normal. She has a history of cyst aspiration from the same breast done by Dr. Birdie SonsByrnette back in 2015. Since than she has been getting her wellness exams at the health department and no lumps have been found. She states this time around the lump is non tender. No family history of breast cancer, ovarian cancer.   Patient Active Problem List   Diagnosis Date Noted  . Tension headache, chronic 07/03/2018  . Migraine without aura and without status migrainosus, not intractable 07/03/2018  . Shoulder pain, right 05/02/2018  . Cervical radiculopathy 07/20/2017  . Breast mass, right 09/09/2014  . Benign breast cyst in female 09/09/2014    Past Surgical History:  Procedure Laterality Date  . FOOT SURGERY  2009  . GYNECOLOGIC CRYOSURGERY  1993  . WISDOM TOOTH EXTRACTION  2011    Family History  Problem Relation Age of Onset  . Other Mother        brain tumor  . Cancer Paternal Grandfather        colon  . Other Maternal Aunt        brain tumor  . Other Maternal Grandmother        brain tumor  . Diabetes Maternal Grandfather   . Hypertension Paternal Grandmother     Social History   Socioeconomic History  . Marital status: Single    Spouse name: Not on file  . Number of children: 3  . Years of education: Not on file  . Highest education level: Not on file  Occupational History    Comment: part   Social Needs  . Financial resource strain: Hard  . Food insecurity    Worry: Often true    Inability: Never true  . Transportation needs    Medical: No    Non-medical: No  Tobacco Use  . Smoking  status: Never Smoker  . Smokeless tobacco: Never Used  Substance and Sexual Activity  . Alcohol use: Yes  . Drug use: No  . Sexual activity: Not Currently    Partners: Male  Lifestyle  . Physical activity    Days per week: 0 days    Minutes per session: 0 min  . Stress: To some extent  Relationships  . Social connections    Talks on phone: More than three times a week    Gets together: Twice a week    Attends religious service: More than 4 times per year    Active member of club or organization: Yes    Attends meetings of clubs or organizations: 1 to 4 times per year    Relationship status: Never married  . Intimate partner violence    Fear of current or ex partner: No    Emotionally abused: No    Physically abused: No    Forced sexual activity: No  Other Topics Concern  . 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  cholecalciferol (VITAMIN D) 1000 units tablet, Take 1,000 Units by mouth daily., Disp: , Rfl:  .  OVER THE COUNTER MEDICATION, Take 2 each by mouth daily. Goli Berries, Disp: , Rfl:  .  VIORELE 0.15-0.02/0.01 MG (21/5) tablet, Take 1 tablet by mouth daily. , Disp: , Rfl:  .  colchicine 0.6 MG tablet, Take 1-2 tablets (0.6-1.2 mg total) by mouth daily. Take two now  and may take one more in 2 hours if not better, may take daily for 3 days and prn (Patient not taking: Reported on 09/30/2019), Disp: 10 tablet, Rfl: 0 .  Multiple Vitamins-Minerals (WOMENS MULTIVITAMIN PO), Take by mouth., Disp: , Rfl:  .  SUMAtriptan (IMITREX) 100 MG tablet, Take 1 tablet (100 mg total) by mouth every 2 (two) hours as needed for migraine. May repeat in 2 hours if headache persists or recurs. (Patient not taking: Reported on 09/30/2019), Disp: 10 tablet,  Rfl: 0  Allergies  Allergen Reactions  . Shellfish Allergy Anaphylaxis  . Naproxen Diarrhea  . Latex Rash    I personally reviewed active problem list, medication list, allergies, family history, social history, health maintenance with the patient/caregiver today.   ROS  Constitutional: Negative for fever or weight change.  Respiratory: Negative for cough and shortness of breath.   Cardiovascular: Negative for chest pain or palpitations.  Gastrointestinal: Negative for abdominal pain, no bowel changes.  Musculoskeletal: Negative for gait problem or joint swelling.  Skin: Negative for rash.  Neurological: Negative for dizziness or headache.  No other specific complaints in a complete review of systems (except as listed in HPI above).  Objective  Vitals:   09/30/19 1504  BP: 112/68  Pulse: 77  Resp: 16  Temp: (!) 97.1 F (36.2 C)  TempSrc: Temporal  SpO2: 97%  Weight: 163 lb (73.9 kg)  Height: 5\' 4"  (1.626 m)    Body mass index is 27.98 kg/m.  Physical Exam  Constitutional: Patient appears well-developed and well-nourished. No distress.  HEENT: head atraumatic, normocephalic, pupils equal and reactive to light Cardiovascular: Normal rate, regular rhythm and normal heart sounds.  No murmur heard. No BLE edema. Pulmonary/Chest: Effort normal and breath sounds normal. No respiratory distress. Abdominal: Soft.  There is no tenderness. Breast: round 3X3 cm mass on right breast at 12 o'clock right breast, right above nipple. No nipple discharge or axillary lymphadenopathy, skin looks normal  Psychiatric: Patient has a normal mood and affect. behavior is normal. Judgment and thought content normal.  PHQ2/9: Depression screen [facility] 2/9 09/30/2019 09/13/2019 03/28/2019 07/03/2018 09/27/2017  Decreased Interest 0 0 0 0 0  Down, Depressed, Hopeless 0 0 0 0 1  PHQ - 2 Score 0 0 0 0 1  Altered sleeping 0 0 0 - 0  Tired, decreased energy 0 0 0 - 3  Change in appetite 0 0 0 - 0   Feeling bad or failure about yourself  0 0 0 - 0  Trouble concentrating 0 0 0 - 0  Moving slowly or fidgety/restless 0 0 0 - 0  Suicidal thoughts 0 0 0 - 0  PHQ-9 Score 0 0 0 - 4  Difficult doing work/chores Not difficult at all Not difficult at all Not difficult at all - Not difficult at all    phq 9  is negative   Fall Risk: Fall Risk  09/13/2019 03/28/2019 07/03/2018 09/27/2017 09/27/2017  Falls in the past year? 0 0 Yes Yes No  Comment - - May 29, 2017 - -  Number falls in past yr: 0 0 1 1 -  Injury with Fall? 0 0 Yes Yes -  Comment - - Right Arm - -  Follow up - - - Education provided -    Assessment & Plan   1. Breast lump on right side at 12 o'clock position  - US BREAST LTD UNI LEFT INC AXILLA; Future - US BREAST LTD UNI RIGHT INC AXILLA; Future - MM DIAG BREAST TOMO BILATERAL; Future

## 2019-10-11 ENCOUNTER — Ambulatory Visit
Admission: RE | Admit: 2019-10-11 | Discharge: 2019-10-11 | Disposition: A | Discharge status: Home / Self Care | Payer: Medicaid Other | Source: Ambulatory Visit | Attending: Family Medicine | Admitting: Family Medicine

## 2019-10-11 DIAGNOSIS — R922 Inconclusive mammogram: Secondary | ICD-10-CM | POA: Diagnosis not present

## 2019-10-11 DIAGNOSIS — N6315 Unspecified lump in the right breast, overlapping quadrants: Secondary | ICD-10-CM | POA: Diagnosis not present

## 2019-10-11 DIAGNOSIS — N6011 Diffuse cystic mastopathy of right breast: Secondary | ICD-10-CM | POA: Diagnosis not present

## 2019-10-15 ENCOUNTER — Other Ambulatory Visit: Payer: Self-pay | Admitting: Family Medicine

## 2019-10-15 DIAGNOSIS — N6310 Unspecified lump in the right breast, unspecified quadrant: Secondary | ICD-10-CM

## 2019-10-29 DIAGNOSIS — Z1211 Encounter for screening for malignant neoplasm of colon: Secondary | ICD-10-CM | POA: Diagnosis not present

## 2019-10-29 DIAGNOSIS — N6009 Solitary cyst of unspecified breast: Secondary | ICD-10-CM | POA: Diagnosis not present

## 2019-11-02 ENCOUNTER — Other Ambulatory Visit: Payer: Self-pay | Admitting: General Surgery

## 2019-11-05 ENCOUNTER — Other Ambulatory Visit: Payer: Self-pay | Admitting: Physician Assistant

## 2019-11-05 DIAGNOSIS — Z3009 Encounter for other general counseling and advice on contraception: Secondary | ICD-10-CM

## 2019-11-05 NOTE — Telephone Encounter (Signed)
This RN called pt at phone # on file for pt. Verified I ws speaking with pt and counseled pt that rx was e-prescribed to pharmacy by provider and to let her know that we will need her to RTC prior to starting her 3rd pack of OCP's for a physical per E. Sciora, CNM order. Pt states understanding.Ronny Bacon, RN

## 2019-11-05 NOTE — Telephone Encounter (Signed)
48 yo with last physical by AFS on 07/19/18 and given 1 year ocp's.  Criss Rosales, PA called in 90 day supply of Kariva on 08/14/2019.  Pt asking for more refills. E-rx Minerva Fester #3 to [facility].  Pt needs physical before begins last pack of ocp's or will not receive more without medical assessment in person

## 2019-11-05 NOTE — Telephone Encounter (Signed)
requesting refill on birth control

## 2019-11-06 ENCOUNTER — Ambulatory Visit: Payer: Medicaid Other | Admitting: Family Medicine

## 2019-11-22 ENCOUNTER — Other Ambulatory Visit
Admission: RE | Admit: 2019-11-22 | Discharge: 2019-11-22 | Disposition: A | Discharge status: Home / Self Care | Payer: Medicaid Other | Source: Ambulatory Visit | Attending: General Surgery | Admitting: General Surgery

## 2019-11-22 ENCOUNTER — Other Ambulatory Visit: Payer: Self-pay

## 2019-11-22 ENCOUNTER — Ambulatory Visit: Payer: Medicaid Other | Admitting: Family Medicine

## 2019-11-22 ENCOUNTER — Ambulatory Visit: Payer: Self-pay

## 2019-11-22 DIAGNOSIS — Z113 Encounter for screening for infections with a predominantly sexual mode of transmission: Secondary | ICD-10-CM

## 2019-11-22 DIAGNOSIS — Z20828 Contact with and (suspected) exposure to other viral communicable diseases: Secondary | ICD-10-CM | POA: Diagnosis not present

## 2019-11-22 DIAGNOSIS — Z01812 Encounter for preprocedural laboratory examination: Secondary | ICD-10-CM | POA: Insufficient documentation

## 2019-11-22 DIAGNOSIS — N760 Acute vaginitis: Secondary | ICD-10-CM | POA: Diagnosis not present

## 2019-11-22 DIAGNOSIS — B9689 Other specified bacterial agents as the cause of diseases classified elsewhere: Secondary | ICD-10-CM

## 2019-11-22 LAB — WET PREP FOR TRICH, YEAST, CLUE
Trichomonas Exam: NEGATIVE
Yeast Exam: NEGATIVE

## 2019-11-22 LAB — SARS CORONAVIRUS 2 (TAT 6-24 HRS): SARS Coronavirus 2: NEGATIVE

## 2019-11-22 MED ORDER — METRONIDAZOLE 500 MG PO TABS: 500.0000 mg | ORAL_TABLET | Freq: Two times a day (BID) | ORAL | 0 refills | Status: AC

## 2019-11-22 NOTE — Progress Notes (Signed)
In for screening due to vaginal irritation; declines HIV/RPR testing Debera Lat, RN Wet prep reviewed-+BV treated per standing order Debera Lat, RN

## 2019-11-22 NOTE — Progress Notes (Signed)
[facility] Department STI clinic/screening visit  Subjective:  Cynthia Woods is a 48 y.o. female being seen today for  Chief Complaint  Patient presents with  . SEXUALLY TRANSMITTED DISEASE     The patient reports they do have symptoms. Patient reports that they do not desire a pregnancy in the next year. They reported they arenot interested in discussing contraception today.   Patient has the following medical conditions:   Patient Active Problem List   Diagnosis Date Noted  . Tension headache, chronic 07/03/2018  . Migraine without aura and without status migrainosus, not intractable 07/03/2018  . Shoulder pain, right 05/02/2018  . Cervical radiculopathy 07/20/2017  . Breast mass, right 09/09/2014  . Benign breast cyst in female 09/09/2014    HPI  Pt reports vaginal discharge x2 months. This started after using a homemade vaginal solution with boric acid and tea tree oil. Denies abd pain, fever, n/v.   See flowsheet for further details and programmatic requirements.    Patient's last menstrual period was 10/28/2019 (approximate). Last sex: 2 days ago. BCM: OCP Desires EC? n/a  No components found for: HCV  The following portions of the patient's history were reviewed and updated as appropriate: allergies, current medications, past medical history, past social history, past surgical history and problem list.  Objective:  There were no vitals filed for this visit.   Physical Exam Vitals and nursing note reviewed.  Constitutional:      Appearance: Normal appearance.  HENT:     Head: Normocephalic and atraumatic.     Mouth/Throat:     Mouth: Mucous membranes are moist.     Pharynx: Oropharynx is clear. No oropharyngeal exudate or posterior oropharyngeal erythema.  Pulmonary:     Effort: Pulmonary effort is normal.  Abdominal:     General: Abdomen is flat.     Palpations: There is no mass.     Tenderness: There is no abdominal tenderness.  There is no rebound.  Genitourinary:    General: Normal vulva.     Exam position: Lithotomy position.     Pubic Area: No rash or pubic lice.      Labia:        Right: No rash or lesion.        Left: No rash or lesion.      Vagina: Vaginal discharge (white, creamy, ph>4.5) present. No erythema, bleeding or lesions.     Cervix: No cervical motion tenderness, discharge, friability, lesion or erythema.     Uterus: Normal.      Adnexa: Right adnexa normal and left adnexa normal.     Rectum: Normal.  Lymphadenopathy:     Head:     Right side of head: No preauricular or posterior auricular adenopathy.     Left side of head: No preauricular or posterior auricular adenopathy.     Cervical: No cervical adenopathy.     Upper Body:     Right upper body: No supraclavicular or axillary adenopathy.     Left upper body: No supraclavicular or axillary adenopathy.     Lower Body: No right inguinal adenopathy. No left inguinal adenopathy.  Skin:    General: Skin is warm and dry.     Findings: No rash.  Neurological:     Mental Status: She is alert and oriented to person, place, and time.      Assessment and Plan:  Cynthia Woods is a 48 y.o. female presenting to the [facility] Department for  STI screening   1. Routine screening for STI (sexually transmitted infection) -Screenings today as below. Treat wet prep per standing order -Patient does not meet criteria for HepB, HepC Screening. Declines HIV and Syphilis screenings. -Counseled on warning s/sx and when to seek care. Recommended condom use with all sex and discussed importance of condom use for STI prevention.  - Chlamydia/Gonorrhea [facility] - WET PREP FOR [place], YEAST, CLUE  2. BV (bacterial vaginosis) Wet prep + for BV, RN to treat per standing order.      Return for screening as needed.  No future appointments.  Kandee Keen, PA-C

## 2019-11-26 ENCOUNTER — Encounter: Payer: Self-pay | Admitting: General Surgery

## 2019-11-27 ENCOUNTER — Ambulatory Visit
Admission: RE | Admit: 2019-11-27 | Discharge: 2019-11-27 | Disposition: A | Discharge status: Home / Self Care | Payer: Medicaid Other | Attending: General Surgery | Admitting: General Surgery

## 2019-11-27 ENCOUNTER — Ambulatory Visit: Payer: Medicaid Other | Admitting: Certified Registered"

## 2019-11-27 ENCOUNTER — Other Ambulatory Visit: Payer: Self-pay

## 2019-11-27 ENCOUNTER — Encounter
Admission: RE | Disposition: A | Discharge status: Home / Self Care | Payer: Self-pay | Source: Home / Self Care | Attending: General Surgery

## 2019-11-27 DIAGNOSIS — Z1211 Encounter for screening for malignant neoplasm of colon: Secondary | ICD-10-CM | POA: Insufficient documentation

## 2019-11-27 HISTORY — PX: COLONOSCOPY WITH PROPOFOL: SHX5780

## 2019-11-27 LAB — POCT PREGNANCY, URINE: Preg Test, Ur: NEGATIVE

## 2019-11-27 SURGERY — COLONOSCOPY WITH PROPOFOL
Anesthesia: General

## 2019-11-27 MED ORDER — LACTATED RINGERS IV SOLN: INTRAVENOUS | Status: DC | PRN

## 2019-11-27 MED ORDER — SODIUM CHLORIDE 0.9 % IV SOLN
INTRAVENOUS | Status: DC
  Administered 2019-11-27: 1000 mL via INTRAVENOUS

## 2019-11-27 MED ORDER — PROPOFOL 500 MG/50ML IV EMUL
INTRAVENOUS | Status: AC
  Filled 2019-11-27: qty 50

## 2019-11-27 MED ORDER — PROPOFOL 10 MG/ML IV BOLUS
INTRAVENOUS | Status: DC | PRN
  Administered 2019-11-27: 50 ug via INTRAVENOUS
  Administered 2019-11-27: 20 ug via INTRAVENOUS

## 2019-11-27 MED ORDER — MIDAZOLAM HCL 2 MG/2ML IJ SOLN
INTRAMUSCULAR | Status: AC
  Filled 2019-11-27: qty 2

## 2019-11-27 MED ORDER — MIDAZOLAM HCL 2 MG/2ML IJ SOLN
INTRAMUSCULAR | Status: DC | PRN
  Administered 2019-11-27: 2 mg via INTRAVENOUS

## 2019-11-27 MED ORDER — PROPOFOL 500 MG/50ML IV EMUL
INTRAVENOUS | Status: DC | PRN
  Administered 2019-11-27: 125 ug/kg/min via INTRAVENOUS

## 2019-11-27 MED ORDER — LIDOCAINE HCL (CARDIAC) PF 100 MG/5ML IV SOSY
PREFILLED_SYRINGE | INTRAVENOUS | Status: DC | PRN
  Administered 2019-11-27: 60 mg via INTRAVENOUS

## 2019-11-27 NOTE — Anesthesia Post-op Follow-up Note (Signed)
Anesthesia QCDR form completed.        

## 2019-11-27 NOTE — Anesthesia Postprocedure Evaluation (Signed)
Anesthesia Post Note  Patient: Cynthia Woods  Procedure(s) Performed: COLONOSCOPY WITH PROPOFOL (N/A )  Patient location during evaluation: Endoscopy Anesthesia Type: General Level of consciousness: awake and alert Pain management: pain level controlled Vital Signs Assessment: post-procedure vital signs reviewed and stable Respiratory status: spontaneous breathing, nonlabored ventilation, respiratory function stable and patient connected to nasal cannula oxygen Cardiovascular status: blood pressure returned to baseline and stable Postop Assessment: no apparent nausea or vomiting Anesthetic complications: no     Last Vitals:  Vitals:   11/27/19 0833 11/27/19 0843  BP: (!) 87/59 104/68  Pulse: 81 70  Resp: 15 12  Temp: 36.6 C   SpO2: 99% 100%    Last Pain:  Vitals:   11/27/19 0843  TempSrc:   PainSc: 0-No pain                 Daven Montz S

## 2019-11-27 NOTE — H&P (Signed)
Cynthia Woods [id number] 23-Jul-1971     HPI:  Healthy 48 y.o woman for a screening colonoscopy.  Tolerated prep well.   Medications Prior to Admission  Medication Sig Dispense Refill Last Dose  . cholecalciferol (VITAMIN D) 1000 units tablet Take 1,000 Units by mouth daily.   11/26/2019 at Unknown time  . KARIVA 0.15-0.02/0.01 MG (21/5) tablet TAKE 1 TABLET BY MOUTH EVERY DAY 84 tablet 0 11/26/2019 at Unknown time  . metroNIDAZOLE (FLAGYL) 500 MG tablet Take 1 tablet (500 mg total) by mouth 2 (two) times daily for 7 days. 14 tablet 0 Past Week at Unknown time  . Multiple Vitamins-Minerals (WOMENS MULTIVITAMIN PO) Take by mouth.   Past Week at Unknown time  . OVER THE COUNTER MEDICATION Take 2 each by mouth daily. Goli Apple Cider Vinegar Gummies   Past Week at Unknown time   Allergies  Allergen Reactions  . Shellfish Allergy Anaphylaxis  . Naproxen Diarrhea  . Latex Rash   History reviewed. No pertinent past medical history. Past Surgical History:  Procedure Laterality Date  . FOOT SURGERY  2009  . GYNECOLOGIC CRYOSURGERY  1993  . WISDOM TOOTH EXTRACTION  2011   Social History   Socioeconomic History  . Marital status: Single    Spouse name: Not on file  . Number of children: 3  . Years of education: Not on file  . Highest education level: Not on file  Occupational History    Comment: part   Tobacco Use  . Smoking status: Never Smoker  . Smokeless tobacco: Never Used  Substance and Sexual Activity  . Alcohol use: Yes    Comment: 1x/wk  . Drug use: No  . Sexual activity: Not Currently    Partners: Male    Birth control/protection: Pill  Other Topics Concern  . Not on file  Social History Narrative   She used to work at [employer] but had to quit after she settled her [employer] comp claim in August 2019    Currently working at [employer] job also has a [employer] part time job    She has 3 children at home   Social Determinants of Radio broadcast assistant  Strain:   . Difficulty of Paying Living Expenses: Not on file  Food Insecurity:   . Worried About Charity fundraiser in the Last Year: Not on file  . Ran Out of Food in the Last Year: Not on file  Transportation Needs:   . Lack of Transportation (Medical): Not on file  . Lack of Transportation (Non-Medical): Not on file  Physical Activity:   . Days of Exercise per Week: Not on file  . Minutes of Exercise per Session: Not on file  Stress:   . Feeling of Stress : Not on file  Social Connections: Slightly Isolated  . Frequency of Communication with Friends and Family: More than three times a week  . Frequency of Social Gatherings with Friends and Family: Twice a week  . Attends Religious Services: More than 4 times per year  . Active Member of Clubs or Organizations: Yes  . Attends Archivist Meetings: 1 to 4 times per year  . Marital Status: Never married  Intimate Partner Violence:   . Fear of Current or Ex-Partner: Not on file  . Emotionally Abused: Not on file  . Physically Abused: Not on file  . Sexually Abused: Not on file   Social History   Social History Narrative   She used  to work at [employer] but had to quit after she settled her [employer]'s comp claim in August 2019    Currently working at [employer] job also has a [employer] part time job    She has 3 children at home     ROS: Negative.     PE: HEENT: Negative. Lungs: Clear. Cardio: RR.  Assessment/Plan:  Proceed with planned endoscopy.   Merrily [facility] 11/27/2019

## 2019-11-27 NOTE — Transfer of Care (Signed)
Immediate Anesthesia Transfer of Care Note  Patient: Cynthia Woods  Procedure(s) Performed: COLONOSCOPY WITH PROPOFOL (N/A )  Patient Location: PACU and Endoscopy Unit  Anesthesia Type:General  Level of Consciousness: awake, alert  and oriented  Airway & Oxygen Therapy: Patient Spontanous Breathing  Post-op Assessment: Report given to RN and Post -op Vital signs reviewed and stable  Post vital signs: Reviewed and stable  Last Vitals:  Vitals Value Taken Time  BP 87/59 11/27/19 0834  Temp    Pulse 80 11/27/19 0834  Resp 14 11/27/19 0834  SpO2 100 % 11/27/19 0834  Vitals shown include unvalidated device data.  Last Pain:  Vitals:   11/27/19 0720  TempSrc: Temporal  PainSc: 0-No pain         Complications: No apparent anesthesia complications

## 2019-11-27 NOTE — Anesthesia Preprocedure Evaluation (Signed)
Anesthesia Evaluation  Patient identified by MRN, date of birth, ID band Patient awake    Reviewed: Allergy & Precautions, NPO status , Patient's Chart, lab work & pertinent test results, reviewed documented beta blocker date and time   Airway Mallampati: II  TM Distance: >3 FB     Dental  (+) Chipped   Pulmonary           Cardiovascular      Neuro/Psych  Headaches,  Neuromuscular disease    GI/Hepatic   Endo/Other    Renal/GU      Musculoskeletal   Abdominal   Peds  Hematology   Anesthesia Other Findings   Reproductive/Obstetrics                             Anesthesia Physical Anesthesia Plan  ASA: II  Anesthesia Plan: General   Post-op Pain Management:    Induction: Intravenous  PONV Risk Score and Plan:   Airway Management Planned:   Additional Equipment:   Intra-op Plan:   Post-operative Plan:   Informed Consent: I have reviewed the patients History and Physical, chart, labs and discussed the procedure including the risks, benefits and alternatives for the proposed anesthesia with the patient or authorized representative who has indicated his/her understanding and acceptance.       Plan Discussed with: CRNA  Anesthesia Plan Comments:         Anesthesia Quick Evaluation

## 2019-11-27 NOTE — Op Note (Signed)
[facility] Gastroenterology Patient Name: Cynthia Woods Procedure Date: 11/27/2019 8:03 AM MRN: [id number] Account #: [id number] Date of Birth: 01/09/1971 Admit Type: Outpatient Age: 48 Room: [facility] ENDO ROOM 1 Gender: Female Note Status: Finalized Procedure:             Colonoscopy Indications:           Screening for colorectal malignant neoplasm Providers:             Robert Bellow, MD Medicines:             Monitored Anesthesia Care Complications:         No immediate complications. Procedure:             Pre-Anesthesia Assessment:                        - Prior to the procedure, a History and Physical was                         performed, and patient medications, allergies and                         sensitivities were reviewed. The patient's tolerance                         of previous anesthesia was reviewed.                        - The risks and benefits of the procedure and the                         sedation options and risks were discussed with the                         patient. All questions were answered and informed                         consent was obtained.                        After obtaining informed consent, the colonoscope was                         passed under direct vision. Throughout the procedure,                         the patient's blood pressure, pulse, and oxygen                         saturations were monitored continuously. The                         Colonoscope was introduced through the anus and                         advanced to the the terminal ileum. The colonoscopy                         was performed without difficulty. The patient  tolerated the procedure well. The quality of the bowel                         preparation was excellent. Findings:      The entire examined colon appeared normal on direct and retroflexion       views. Impression:            - The entire examined  colon is normal on direct and                         retroflexion views.                        - No specimens collected. Recommendation:        - Repeat colonoscopy in 10 years for screening                         purposes. Procedure Code(s):     --- Professional ---                        [phone], Colonoscopy, flexible; diagnostic, including                         collection of specimen(s) by brushing or washing, when                         performed (separate procedure) Diagnosis Code(s):     --- Professional ---                        Z12.11, Encounter for screening for malignant neoplasm                         of colon CPT copyright 2019 American Medical Association. All rights reserved. The codes documented in this report are preliminary and upon coder review may  be revised to meet current compliance requirements. Earline Mayotte, MD 11/27/2019 8:30:24 AM This report has been signed electronically. Number of Addenda: 0 Note Initiated On: 11/27/2019 8:03 AM Scope Withdrawal Time: 0 hours 11 minutes 48 seconds  Total Procedure Duration: 0 hours 23 minutes 1 second       [facility]

## 2019-12-02 ENCOUNTER — Encounter: Payer: Self-pay | Admitting: *Deleted

## 2019-12-19 ENCOUNTER — Telehealth: Payer: Self-pay | Admitting: Family Medicine

## 2019-12-19 NOTE — Telephone Encounter (Signed)
TC with patient. States lost MTZ half through treatment for BV in December. Thinks she has BV again. Appt scheduled Richmond Campbell, RN

## 2019-12-24 ENCOUNTER — Other Ambulatory Visit: Payer: Self-pay

## 2019-12-24 ENCOUNTER — Encounter: Payer: Self-pay | Admitting: Family Medicine

## 2019-12-24 ENCOUNTER — Ambulatory Visit: Payer: Medicaid Other | Admitting: Family Medicine

## 2019-12-24 DIAGNOSIS — B9689 Other specified bacterial agents as the cause of diseases classified elsewhere: Secondary | ICD-10-CM | POA: Diagnosis not present

## 2019-12-24 DIAGNOSIS — Z113 Encounter for screening for infections with a predominantly sexual mode of transmission: Secondary | ICD-10-CM | POA: Diagnosis not present

## 2019-12-24 DIAGNOSIS — N760 Acute vaginitis: Secondary | ICD-10-CM | POA: Diagnosis not present

## 2019-12-24 LAB — WET PREP FOR TRICH, YEAST, CLUE
Trichomonas Exam: NEGATIVE
Yeast Exam: NEGATIVE

## 2019-12-24 MED ORDER — METRONIDAZOLE 500 MG PO TABS: 500.0000 mg | ORAL_TABLET | Freq: Two times a day (BID) | ORAL | 0 refills | Status: AC

## 2019-12-24 NOTE — Progress Notes (Signed)
Here for wet mount only.  Declines all other testing. Feels she has BV Richmond Campbell, RN   Wet mount reviewed. Patient tx'd for BV per C. Latta VO Richmond Campbell, RN

## 2019-12-24 NOTE — Progress Notes (Signed)
  [facility] Department STI clinic/screening visit  Subjective:  Cynthia Woods is a 49 y.o. female being seen today for an STI screening visit. The patient reports they do have symptoms.  Patient reports that they do not desire a pregnancy in the next year.   They reported they are not interested in discussing contraception today.  No LMP recorded.   Patient has the following medical conditions:   Patient Active Problem List   Diagnosis Date Noted  . Tension headache, chronic 07/03/2018  . Migraine without aura and without status migrainosus, not intractable 07/03/2018  . Shoulder pain, right 05/02/2018  . Cervical radiculopathy 07/20/2017  . Breast mass, right 09/09/2014  . Benign breast cyst in female 09/09/2014    Chief Complaint  Patient presents with  . SEXUALLY TRANSMITTED DISEASE    Screening     HPI  Patient reports that she has noted a return in her symptoms of discharge.  Client states that she was treated for BV in December.  States she took 5 days of Metronidazole d/t to lost RX.  She believes that her BV has returned D/t disch and slight odor.  See flowsheet for further details and programmatic requirements.    The following portions of the patient's history were reviewed and updated as appropriate: allergies, current medications, past medical history, past social history, past surgical history and problem list.  Objective:  There were no vitals filed for this visit.  Physical Exam Client declines PE.  Self-collect wet prep  Assessment and Plan:  Cynthia Woods is a 49 y.o. female presenting to the [facility] Department for STI screening  1. Routine screening for STI (sexually transmitted infection) Client declines bloodwork and GC/Chlamydia testing today - WET PREP FOR [place], YEAST, CLUE  2. Screening examination for venereal disease  3.  Bacterial vaginosis Metronidazole 500 mg po BID x 5 days.     No  follow-ups on file.  No future appointments.  Larene Pickett, FNP

## 2020-01-28 ENCOUNTER — Telehealth: Payer: Self-pay

## 2020-01-28 NOTE — Telephone Encounter (Signed)
TC with patient.  Reports has been trying to get in touch with someone here at [facility] but kept getting d/c'd on the phone. Needs refill on BC. Took last pill this am and is going out of town for a week tomorrow am.    Garnette Scheuermann refill (#90 day supply) called [facility] [address] per Maximiano Coss PA VO.  Patient scheduled to see a provider on 02/12/20. Rx called in under K. Alvester Morin MD (unsure if Cherlynn Polo has been set up as medicaid provider).    LM on paitent's VM that Rx has been phoned in and to call RN back if any other questions or concerns.  Also reminded patient that she may utilize Mychart to communicate needs in the future. Richmond Campbell, RN

## 2020-01-28 NOTE — Telephone Encounter (Signed)
This is appropriate. I think we are still figuring out the Medicaid issue. Approve of prescription under my medical direction.   Harlow AsaAlvester Morin MD, MPH Medical Director [facility] Dept

## 2020-02-09 DIAGNOSIS — M62838 Other muscle spasm: Secondary | ICD-10-CM | POA: Diagnosis not present

## 2020-02-12 ENCOUNTER — Ambulatory Visit: Payer: Self-pay

## 2020-03-06 ENCOUNTER — Telehealth: Payer: Self-pay

## 2020-03-06 NOTE — Telephone Encounter (Signed)
Copied from CRM [id number]. Topic: Referral - Request for Referral >> Mar 05, 2020 11:37 AM Randol Kern wrote: Has patient seen PCP for this complaint? No. *If NO, is insurance requiring patient see PCP for this issue before PCP can refer them? Referral for which specialty: Primary care provider Preferred provider/office: Highest recommended by Dr. Carlynn Purl Reason for referral: Pt has a change in insurance, and unfortunately has to seek a new PCP. Please advise  Best contact: [phone]

## 2020-03-06 NOTE — Telephone Encounter (Signed)
Called to inform her unfortunately Dr. Carlynn Purl does not know any PCP's providers outside of [facility]. Sorry,  Patient states she will miss Dr. Carlynn Purl.

## 2020-03-11 ENCOUNTER — Telehealth: Payer: Self-pay

## 2020-03-11 NOTE — Telephone Encounter (Signed)
Left message for patient to call office back.

## 2020-03-11 NOTE — Telephone Encounter (Signed)
Appointment made

## 2020-03-11 NOTE — Telephone Encounter (Signed)
Patient returning call to [facility].    Cb# [phone] Ask to speak with Doreen Salvage.

## 2020-03-11 NOTE — Telephone Encounter (Signed)
Copied from CRM [id number]. Topic: General - Other >> Mar 10, 2020  4:51 PM Mcneil, Ja-Kwan wrote: Reason for CRM: Pt requests that Dr. Carlynn Purl call her back asap.

## 2020-03-12 ENCOUNTER — Ambulatory Visit (INDEPENDENT_AMBULATORY_CARE_PROVIDER_SITE_OTHER): Payer: Medicaid Other | Admitting: Family Medicine

## 2020-03-12 ENCOUNTER — Encounter: Payer: Self-pay | Admitting: Family Medicine

## 2020-03-12 ENCOUNTER — Other Ambulatory Visit: Payer: Self-pay

## 2020-03-12 DIAGNOSIS — N3001 Acute cystitis with hematuria: Secondary | ICD-10-CM

## 2020-03-12 LAB — POCT URINALYSIS DIPSTICK
Bilirubin, UA: NEGATIVE
Glucose, UA: NEGATIVE
Ketones, UA: 15
Nitrite, UA: NEGATIVE
Protein, UA: POSITIVE — AB
Spec Grav, UA: 1.015 (ref 1.010–1.025)
Urobilinogen, UA: NEGATIVE E.U./dL — AB
pH, UA: 5 (ref 5.0–8.0)

## 2020-03-12 MED ORDER — CIPROFLOXACIN HCL 250 MG PO TABS: 250.0000 mg | ORAL_TABLET | Freq: Two times a day (BID) | ORAL | 0 refills | Status: DC

## 2020-03-12 NOTE — Progress Notes (Signed)
Name: Cynthia Woods   MRN: [id number]    DOB: 07/16/1971   Date:03/12/2020       Progress Note  Subjective  Chief Complaint  Chief Complaint  Patient presents with  . Urinary Tract Infection    I connected with  [name] on 03/12/20 at  8:40 AM EDT by telephone and verified that I am speaking with the correct person using two identifiers.   I discussed the limitations, risks, security and privacy concerns of performing an evaluation and management service by telephone and the availability of in person appointments. Staff also discussed with the patient that there may be a patient responsible charge related to this service. Patient Location: at work  Provider Location: [facility]   HPI  Cystitis: she is states she does not like to use bathroom in public restrooms. She does not use the bathroom all day while at work. She states she drinks water. She states symptoms started last week, she has dysuria at the end of micturition, hesitancy . No fever , chills, nausea or vomiting. She does not see any blood in her urine   Patient Active Problem List   Diagnosis Date Noted  . Tension headache, chronic 07/03/2018  . Migraine without aura and without status migrainosus, not intractable 07/03/2018  . Shoulder pain, right 05/02/2018  . Cervical radiculopathy 07/20/2017  . Breast mass, right 09/09/2014  . Benign breast cyst in female 09/09/2014    Social History   Tobacco Use  . Smoking status: Never Smoker  . Smokeless tobacco: Never Used  Substance Use Topics  . Alcohol use: Yes    Comment: 1x/wk     Current Outpatient Medications:  .  cholecalciferol (VITAMIN D) 1000 units tablet, Take 1,000 Units by mouth daily., Disp: , Rfl:  .  KARIVA 0.15-0.02/0.01 MG (21/5) tablet, TAKE 1 TABLET BY MOUTH EVERY DAY, Disp: 84 tablet, Rfl: 0 .  Multiple Vitamins-Minerals (WOMENS MULTIVITAMIN PO), Take by mouth., Disp: , Rfl:  .  OVER THE COUNTER MEDICATION, Take 2 each by mouth  daily. Goli Apple Cider Vinegar Gummies, Disp: , Rfl:   Allergies  Allergen Reactions  . Shellfish Allergy Anaphylaxis  . Naproxen Diarrhea  . Latex Rash    I personally reviewed active problem list, medication list, allergies, family history, social history with the patient/caregiver today.  ROS  Ten systems reviewed and is negative except as mentioned in HPI   Objective  Virtual encounter, vitals not obtained.  There is no height or weight on file to calculate BMI.  Nursing Note and Vital Signs reviewed.  Physical Exam  Awake, alert and oriented   Assessment & Plan  1. Acute cystitis with hematuria  - CULTURE, URINE COMPREHENSIVE - ciprofloxacin (CIPRO) 250 MG tablet; Take 1 tablet (250 mg total) by mouth 2 (two) times daily.  Dispense: 6 tablet; Refill: 0   -Red flags and when to present for emergency care or RTC including fever >101.56F, chest pain, shortness of breath, new/worsening/un-resolving symptoms,  reviewed with patient at time of visit. Follow up and care instructions discussed and provided in AVS. - I discussed the assessment and treatment plan with the patient. The patient was provided an opportunity to ask questions and all were answered. The patient agreed with the plan and demonstrated an understanding of the instructions.  - The patient was advised to call back or seek an in-person evaluation if the symptoms worsen or if the condition fails to improve as anticipated.  I provided 15  minutes of non-face-to-face time during this encounter.  Loistine Chance, MD

## 2020-03-13 ENCOUNTER — Ambulatory Visit: Payer: Medicaid Other | Admitting: Family Medicine

## 2020-03-14 LAB — CULTURE, URINE COMPREHENSIVE
MICRO NUMBER:: 10343268
SPECIMEN QUALITY:: ADEQUATE

## 2020-03-18 ENCOUNTER — Telehealth: Payer: Self-pay

## 2020-03-18 NOTE — Telephone Encounter (Signed)
Copied from CRM [id number]. Topic: General - Other >> Mar 18, 2020  8:32 AM Jaquita Rector A wrote: Reason for CRM: Patient called to ask if Dr Carlynn Purl can give her a call as soon as possible. Patient would not give any details just said it was very important. Can be reached at Ph# [phone] or [phone]

## 2020-03-18 NOTE — Telephone Encounter (Signed)
Patient called to discuss her daughter.office note put in

## 2020-03-25 ENCOUNTER — Ambulatory Visit: Payer: Medicaid Other | Admitting: Family Medicine

## 2020-03-26 ENCOUNTER — Telehealth: Payer: Self-pay | Admitting: Emergency Medicine

## 2020-03-26 NOTE — Telephone Encounter (Signed)
Patient called and stating her daughter stated negative on the first Covid test the rapid test  and patient was symptomatic. Symptoms started on the 12th.  And [facility] and the test came back positive. Her [employer] employee health is testing Nereyda today and out until May 6th.  Patient notified Per Dr.Sowles to report to Health Department for Guidelines

## 2020-03-26 NOTE — Telephone Encounter (Signed)
Copied from CRM [id number]. Topic: General - Other >> Mar 26, 2020  8:12 AM Tamela Oddi wrote: Reason for CRM: Patient would like Myriam Jacobson, Dr. Carlynn Purl nurse to call her.  She stated that she has a questions for her and would not elaborate.  CB# [phone]

## 2020-05-08 ENCOUNTER — Encounter: Payer: Self-pay | Admitting: Family Medicine

## 2020-05-08 ENCOUNTER — Other Ambulatory Visit: Payer: Self-pay

## 2020-05-08 ENCOUNTER — Ambulatory Visit (LOCAL_COMMUNITY_HEALTH_CENTER): Payer: BC Managed Care – PPO | Admitting: Family Medicine

## 2020-05-08 ENCOUNTER — Ambulatory Visit: Payer: Medicaid Other

## 2020-05-08 VITALS — BP 112/77 | Ht 64.0 in | Wt 163.0 lb

## 2020-05-08 DIAGNOSIS — N926 Irregular menstruation, unspecified: Secondary | ICD-10-CM

## 2020-05-08 DIAGNOSIS — N760 Acute vaginitis: Secondary | ICD-10-CM

## 2020-05-08 DIAGNOSIS — Z3009 Encounter for other general counseling and advice on contraception: Secondary | ICD-10-CM

## 2020-05-08 DIAGNOSIS — Z01419 Encounter for gynecological examination (general) (routine) without abnormal findings: Secondary | ICD-10-CM | POA: Diagnosis not present

## 2020-05-08 DIAGNOSIS — B9689 Other specified bacterial agents as the cause of diseases classified elsewhere: Secondary | ICD-10-CM | POA: Diagnosis not present

## 2020-05-08 DIAGNOSIS — Z113 Encounter for screening for infections with a predominantly sexual mode of transmission: Secondary | ICD-10-CM | POA: Diagnosis not present

## 2020-05-08 DIAGNOSIS — N898 Other specified noninflammatory disorders of vagina: Secondary | ICD-10-CM

## 2020-05-08 LAB — WET PREP FOR TRICH, YEAST, CLUE
Trichomonas Exam: NEGATIVE
Yeast Exam: NEGATIVE

## 2020-05-08 LAB — PREGNANCY, URINE: Preg Test, Ur: NEGATIVE

## 2020-05-08 MED ORDER — MULTIVITAMINS PO CAPS: 1.0000 | ORAL_CAPSULE | Freq: Every day | ORAL | 0 refills | Status: AC

## 2020-05-08 MED ORDER — METRONIDAZOLE 0.75 % VA GEL: 1.0000 | Freq: Every day | VAGINAL | 5 refills | Status: DC

## 2020-05-08 MED ORDER — METRONIDAZOLE 500 MG PO TABS: 500.0000 mg | ORAL_TABLET | Freq: Two times a day (BID) | ORAL | 0 refills | Status: DC

## 2020-05-08 MED ORDER — DESOGESTREL-ETHINYL ESTRADIOL 0.15-0.02/0.01 MG (21/5) PO TABS: 1.0000 | ORAL_TABLET | Freq: Every day | ORAL | 3 refills | Status: DC

## 2020-05-08 NOTE — Addendum Note (Signed)
Addended by: Tawny Hopping A on: 05/08/2020 04:52 PM   Modules accepted: Orders

## 2020-05-08 NOTE — Progress Notes (Signed)
[employer] results reviewed by provider K. Alvester Morin, MD. MVI's given. Tawny Hopping, RN

## 2020-05-08 NOTE — Patient Instructions (Signed)
Probiotics    Culturelle

## 2020-05-08 NOTE — Progress Notes (Signed)
Here today for PE, Pap Smear and pill refill. Last PE here was 07/19/2018 and last Pap Smear here was 05/19/2015. Declines STD screening including bloodwork. Tawny Hopping, RN

## 2020-05-08 NOTE — Progress Notes (Signed)
Family Planning Visit- Initial Visit/First Epic visit  Subjective:  Cynthia Woods is a 49 y.o.  [phone]   being seen today for an initial well woman visit and to discuss family planning options.  She is currently using none for pregnancy prevention. Patient reports she does not want a pregnancy in the next year.  Patient has the following medical conditions has Breast mass, right; Benign breast cyst in female; Cervical radiculopathy; Shoulder pain, right; Tension headache, chronic; and Migraine without aura and without status migrainosus, not intractable on their problem list.  Chief Complaint  Patient presents with  . Gynecologic Exam  . Contraception    Patient reports Vaginal discharge, reports recurrent BV.    Body mass index is 27.98 kg/m. - Patient is eligible for diabetes screening based on BMI and age >49?  no HA1C ordered? not applicable  Patient reports 1 of partners in last year. Desires STI screening?  No - Declines GCCT, HIV, RPR. only wants wet mount  Has patient been screened once for HCV in the past?  No  No results found for: HCVAB  Does the patient have current of drug use, have a partner with drug use, and/or has been incarcerated since last result? No  If yes-- Screen for HCV through [facility]   Does the patient meet criteria for HBV testing? No  Criteria:  -Household, sexual or needle sharing contact with HBV -History of drug use -HIV positive -Those with known Hep C   Health Maintenance Due  Topic Date Due  . PAP SMEAR-Modifier  05/17/2020    Review of Systems  Constitutional: Negative for chills and fever.  Eyes: Negative for blurred vision and double vision.  Respiratory: Negative for cough and shortness of breath.   Cardiovascular: Negative for chest pain and orthopnea.  Gastrointestinal: Negative for nausea and vomiting.  Genitourinary: Negative for dysuria, flank pain and frequency.  Musculoskeletal: Negative for myalgias.  Skin:  Negative for rash.  Neurological: Negative for dizziness, tingling, weakness and headaches.  Endo/Heme/Allergies: Does not bruise/bleed easily.  Psychiatric/Behavioral: Negative for depression and suicidal ideas. The patient is not nervous/anxious.     The following portions of the patient's history were reviewed and updated as appropriate: allergies, current medications, past family history, past medical history, past social history, past surgical history and problem list. Problem list updated.   See flowsheet for other program required questions.  Objective:   Vitals:   05/08/20 1544  BP: 112/77  Weight: 163 lb (73.9 kg)  Height: 5\' 4"  (1.626 m)    Physical Exam Vitals and nursing note reviewed.  Constitutional:      Appearance: Normal appearance.  HENT:     Head: Normocephalic.  Pulmonary:     Effort: Pulmonary effort is normal.  Musculoskeletal:        General: Normal range of motion.  Skin:    General: Skin is warm and dry.  Neurological:     Mental Status: She is alert. Mental status is at baseline.       Assessment and Plan:  Cynthia Woods is a 49 y.o. female presenting to the [facility] Department for an initial well woman exam/family planning visit  Contraception counseling: Reviewed all forms of birth control options in the tiered based approach. available including abstinence; over the counter/barrier methods; hormonal contraceptive medication including pill, patch, ring, injection,contraceptive implant, ECP; hormonal and nonhormonal IUDs; permanent sterilization options including vasectomy and the various tubal sterilization modalities. Risks, benefits, and typical  effectiveness rates were reviewed.  Questions were answered.  Written information was also given to the patient to review.  Patient desires OCPMinerva Fester, this was prescribed for patient. She will follow up in  1 yr for surveillance.  She was told to call with any further questions,  or with any concerns about this method of contraception.  Emphasized use of condoms 100% of the time for STI prevention.  1. Screening examination for venereal disease Treat wet mount - sent in rx.  Recommended one time HCV, patient declined. Has to go to PCP for blood work by 5 - [place], YEAST, CLUE  2. Vaginal discharge - WET PREP FOR [place], YEAST, CLUE - metroNIDAZOLE (FLAGYL) 500 MG tablet; Take 1 tablet (500 mg total) by mouth 2 (two) times daily.  Dispense: 14 tablet; Refill: 0 - metroNIDAZOLE (METROGEL) 0.75 % vaginal gel; Place 1 Applicatorful vaginally at bedtime. Use only if BV sx reoccur after use of oral metronidazole pills. Apply one applicatorful to vagina at bedtime for 10 days, then twice a week for 6 months.  Dispense: 70 g; Refill: 5  3. Well woman exam with routine gynecological exam - Reviewed preventative measures - RN to give multivitamin - Health history form filled out - Reviewed and updated medical history. Likes OCP and wants to continue. Has unprotected sex in early May, pregnancy test negative today - IGP, rfx Aptima HPV ASCU - Pregnancy, urine  4. Family planning - desogestrel-ethinyl estradiol (KARIVA) 0.15-0.02/0.01 MG (21/5) tablet; Take 1 tablet by mouth daily.  Dispense: 84 tablet; Refill: 3  5. Missed period UPT neg - Pregnancy, urine  6. BV (bacterial vaginosis) - Reviewed preventative measure - metroNIDAZOLE (FLAGYL) 500 MG tablet; Take 1 tablet (500 mg total) by mouth 2 (two) times daily.  Dispense: 14 tablet; Refill: 0 - metroNIDAZOLE (METROGEL) 0.75 % vaginal gel; Place 1 Applicatorful vaginally at bedtime. Use only if BV sx reoccur after use of oral metronidazole pills. Apply one applicatorful to vagina at bedtime for 10 days, then twice a week for 6 months.  Dispense: 70 g; Refill: 5  Return in about 1 year (around 05/08/2021) for Yearly wellness exam.  No future appointments.  Caren Macadam, MD

## 2020-05-13 LAB — IGP, RFX APTIMA HPV ASCU: PAP Smear Comment: 0

## 2020-06-04 DIAGNOSIS — Z419 Encounter for procedure for purposes other than remedying health state, unspecified: Secondary | ICD-10-CM | POA: Diagnosis not present

## 2020-07-01 ENCOUNTER — Other Ambulatory Visit: Payer: Self-pay

## 2020-07-01 ENCOUNTER — Encounter: Payer: Self-pay | Admitting: Family Medicine

## 2020-07-01 DIAGNOSIS — Z13220 Encounter for screening for lipoid disorders: Secondary | ICD-10-CM

## 2020-07-01 DIAGNOSIS — Z131 Encounter for screening for diabetes mellitus: Secondary | ICD-10-CM

## 2020-07-01 DIAGNOSIS — R0789 Other chest pain: Secondary | ICD-10-CM

## 2020-07-01 DIAGNOSIS — E559 Vitamin D deficiency, unspecified: Secondary | ICD-10-CM

## 2020-07-01 DIAGNOSIS — R030 Elevated blood-pressure reading, without diagnosis of hypertension: Secondary | ICD-10-CM

## 2020-07-01 NOTE — Progress Notes (Signed)
lipi

## 2020-07-02 ENCOUNTER — Ambulatory Visit (INDEPENDENT_AMBULATORY_CARE_PROVIDER_SITE_OTHER): Payer: BC Managed Care – PPO | Admitting: Family Medicine

## 2020-07-02 ENCOUNTER — Encounter: Payer: Self-pay | Admitting: Family Medicine

## 2020-07-02 ENCOUNTER — Other Ambulatory Visit: Payer: Self-pay

## 2020-07-02 VITALS — BP 100/76 | HR 110 | Temp 97.3°F | Resp 16 | Ht 64.0 in | Wt 160.9 lb

## 2020-07-02 DIAGNOSIS — E7800 Pure hypercholesterolemia, unspecified: Secondary | ICD-10-CM | POA: Diagnosis not present

## 2020-07-02 DIAGNOSIS — G44229 Chronic tension-type headache, not intractable: Secondary | ICD-10-CM

## 2020-07-02 DIAGNOSIS — R0789 Other chest pain: Secondary | ICD-10-CM | POA: Diagnosis not present

## 2020-07-02 LAB — COMPREHENSIVE METABOLIC PANEL
ALT: 12 IU/L (ref 0–32)
AST: 16 IU/L (ref 0–40)
Albumin/Globulin Ratio: 1.6 (ref 1.2–2.2)
Albumin: 3.9 g/dL (ref 3.8–4.8)
Alkaline Phosphatase: 57 IU/L (ref 48–121)
BUN/Creatinine Ratio: 15 (ref 9–23)
BUN: 13 mg/dL (ref 6–24)
Bilirubin Total: <0.2 mg/dL (ref 0.0–1.2)
CO2: 21 mmol/L (ref 20–29)
Calcium: 8.9 mg/dL (ref 8.7–10.2)
Chloride: 104 mmol/L (ref 96–106)
Creatinine, Ser: 0.88 mg/dL (ref 0.57–1.00)
GFR calc Af Amer: 91 mL/min/{1.73_m2} (ref >59)
GFR calc non Af Amer: 79 mL/min/{1.73_m2} (ref >59)
Globulin, Total: 2.5 g/dL (ref 1.5–4.5)
Glucose: 106 mg/dL — ABNORMAL HIGH (ref 65–99)
Potassium: 4.5 mmol/L (ref 3.5–5.2)
Sodium: 138 mmol/L (ref 134–144)
Total Protein: 6.4 g/dL (ref 6.0–8.5)

## 2020-07-02 LAB — LIPID PANEL
Chol/HDL Ratio: 3.8 ratio (ref 0.0–4.4)
Cholesterol, Total: 279 mg/dL — ABNORMAL HIGH (ref 100–199)
HDL: 73 mg/dL (ref >39)
LDL Chol Calc (NIH): 192 mg/dL — ABNORMAL HIGH (ref 0–99)
Triglycerides: 83 mg/dL (ref 0–149)
VLDL Cholesterol Cal: 14 mg/dL (ref 5–40)

## 2020-07-02 LAB — CBC WITH DIFFERENTIAL/PLATELET
Basophils Absolute: 0 10*3/uL (ref 0.0–0.2)
Basos: 0 %
EOS (ABSOLUTE): 0.1 10*3/uL (ref 0.0–0.4)
Eos: 1 %
Hematocrit: 39.1 % (ref 34.0–46.6)
Hemoglobin: 13.2 g/dL (ref 11.1–15.9)
Immature Grans (Abs): 0 10*3/uL (ref 0.0–0.1)
Immature Granulocytes: 0 %
Lymphocytes Absolute: 1.9 10*3/uL (ref 0.7–3.1)
Lymphs: 34 %
MCH: 30.9 pg (ref 26.6–33.0)
MCHC: 33.8 g/dL (ref 31.5–35.7)
MCV: 92 fL (ref 79–97)
Monocytes Absolute: 0.5 10*3/uL (ref 0.1–0.9)
Monocytes: 9 %
Neutrophils Absolute: 3 10*3/uL (ref 1.4–7.0)
Neutrophils: 56 %
Platelets: 230 10*3/uL (ref 150–450)
RBC: 4.27 x10E6/uL (ref 3.77–5.28)
RDW: 12.3 % (ref 11.7–15.4)
WBC: 5.4 10*3/uL (ref 3.4–10.8)

## 2020-07-02 LAB — TSH: TSH: 0.986 u[IU]/mL (ref 0.450–4.500)

## 2020-07-02 LAB — VITAMIN D 25 HYDROXY (VIT D DEFICIENCY, FRACTURES): Vit D, 25-Hydroxy: 32.2 ng/mL (ref 30.0–100.0)

## 2020-07-02 LAB — HEMOGLOBIN A1C
Est. average glucose Bld gHb Est-mCnc: 111 mg/dL
Hgb A1c MFr Bld: 5.5 % (ref 4.8–5.6)

## 2020-07-02 MED ORDER — ROSUVASTATIN CALCIUM 10 MG PO TABS: 10.0000 mg | ORAL_TABLET | Freq: Every day | ORAL | 1 refills | Status: DC

## 2020-07-02 MED ORDER — DICLOFENAC SODIUM 75 MG PO TBEC: 75.0000 mg | DELAYED_RELEASE_TABLET | Freq: Two times a day (BID) | ORAL | 0 refills | Status: DC

## 2020-07-02 NOTE — Progress Notes (Signed)
Name: Cynthia Woods   MRN: [id number]    DOB: 1971/07/29   Date:07/02/2020       Progress Note  Subjective  Chief Complaint  Chief Complaint  Patient presents with  . Chest Pain    She has had chest pain x 3 months. She denies injury to area. She said it feels like she has been punched in the center of her chest and also feels like pulling at times. It mostly bothers her when she laughs or exercises.    HPI  Chest pain: going on for months, however getting progressively worse, it is constant on left side of chest, worse with certain movements and when laughing, not associated with nausea, vomiting or diaphoresis. Denies sob or decrease in exercise tolerance. She goes to the gym and able to work out.   Hyperlipidemia: LDL above 180, explained importance of starting statin therapy , discussed possible side effects  Headache: she has a long history of tension headaches and migraine . She does not like taking preventive medication. She states recently she has been more stress at work. She states she had a MRI done in 2007 because brain tumors occur in her family ( mother, grandmother and aunt) . 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 She denies headaches at night, not associated with neuro deficit , nausea or vomiting.    Patient Active Problem List   Diagnosis Date Noted  . Tension headache, chronic 07/03/2018  . Migraine without aura and without status migrainosus, not intractable 07/03/2018  . Shoulder pain, right 05/02/2018  . Cervical radiculopathy 07/20/2017  . Breast mass, right 09/09/2014  . Benign breast cyst in female 09/09/2014    Past Surgical History:  Procedure Laterality Date  . COLONOSCOPY WITH PROPOFOL N/A 11/27/2019   Procedure: COLONOSCOPY WITH PROPOFOL;  Surgeon: Robert Bellow, MD;  Location: [facility] ENDOSCOPY;  Service:  Endoscopy;  Laterality: N/A;  . FOOT SURGERY  2009  . GYNECOLOGIC CRYOSURGERY  1993  . WISDOM TOOTH EXTRACTION  2011    Family History  Problem Relation Age of Onset  . Other Mother        brain tumor  . Cancer Paternal Grandfather        colon  . Other Maternal Aunt        brain tumor  . Other Maternal Grandmother        brain tumor  . Cancer Maternal Grandmother   . Diabetes Maternal Grandfather   . Hypertension Paternal Grandmother     Social History   Tobacco Use  . Smoking status: Never Smoker  . Smokeless tobacco: Never Used  Substance Use Topics  . Alcohol use: Yes    Comment: 1x/wk     Current Outpatient Medications:  .  cholecalciferol (VITAMIN D) 1000 units tablet, Take 1,000 Units by mouth daily., Disp: , Rfl:  .  desogestrel-ethinyl estradiol (KARIVA) 0.15-0.02/0.01 MG (21/5) tablet, Take 1 tablet by mouth daily., Disp: 84 tablet, Rfl: 3 .  Multiple Vitamin (MULTIVITAMIN) capsule, Take 1 capsule by mouth daily for 100 doses., Disp: 100 capsule, Rfl: 0 .  Multiple Vitamins-Minerals (WOMENS MULTIVITAMIN PO), Take by mouth., Disp: , Rfl:  .  OVER THE COUNTER MEDICATION, Take 2 each by mouth daily. Goli Apple Cider Vinegar Gummies, Disp: , Rfl:  .  diclofenac (VOLTAREN) 75 MG EC tablet, Take 1 tablet (75 mg total)  by mouth 2 (two) times daily., Disp: 30 tablet, Rfl: 0 .  rosuvastatin (CRESTOR) 10 MG tablet, Take 1 tablet (10 mg total) by mouth daily., Disp: 90 tablet, Rfl: 1  Allergies  Allergen Reactions  . Shellfish Allergy Anaphylaxis  . Naproxen Diarrhea  . Latex Rash    I personally reviewed active problem list, medication list, allergies, family history, social history, health maintenance with the patient/caregiver today.   ROS  Ten systems reviewed and is negative except as mentioned in HPI   Objective  Vitals:   07/02/20 0954  BP: 100/76  Pulse: (!) 110  Resp: 16  Temp: (!) 97.3 F (36.3 C)  TempSrc: Temporal  SpO2: 99%  Weight: 160 lb  14.4 oz (73 kg)  Height: 5' 4" (1.626 m)    Body mass index is 27.62 kg/m.  Physical Exam  Constitutional: Patient appears well-developed and well-nourished.  No distress.  HEENT: head atraumatic, normocephalic, pupils equal and reactive to light, neck supple Cardiovascular: Normal rate, regular rhythm and normal heart sounds.  No murmur heard. No BLE edema. Pulmonary/Chest: Effort normal and breath sounds normal. No respiratory distress. Muscular Skeletal: pain during palpation of left costochondral area Abdominal: Soft.  There is no tenderness. Psychiatric: Patient has a normal mood and affect. behavior is normal. Judgment and thought content normal.  Recent Results (from the past 2160 hour(s))  IGP, rfx Aptima HPV ASCU     Status: None   Collection Time: 05/08/20  4:35 PM  Result Value Ref Range   DIAGNOSIS: Comment     Comment: NEGATIVE FOR INTRAEPITHELIAL LESION OR MALIGNANCY. REACTIVE CELLULAR CHANGES AND/OR REPAIR ARE PRESENT.    Recommendation: Comment     Comment: Suggest follow up as clinically appropriate.   Specimen adequacy: Comment     Comment: Satisfactory for evaluation. Endocervical and/or squamous metaplastic cells (endocervical component) are present.    Clinician Provided ICD10 Comment     Comment: Z01.419   Performed by: Comment     Comment: Chapman Moss, Cytotechnologist (ASCP)   Electronically signed by: Comment     Comment: Gretta Arab, MD, Pathologist   PAP Smear Comment .    Note: Comment     Comment: The Pap smear is a screening test designed to aid in the detection of premalignant and malignant conditions of the uterine cervix.  It is not a diagnostic procedure and should not be used as the sole means of detecting cervical cancer.  Both false-positive and false-negative reports do occur.    Test Methodology Comment     Comment: This liquid based ThinPrep(R) pap test was screened with the use of an image guided system.    PAP Reflex  Comment     Comment: The HPV DNA reflex criteria were not met with this specimen result therefore, no HPV testing was performed.   Pregnancy, urine     Status: None   Collection Time: 05/08/20  4:40 PM  Result Value Ref Range   Preg Test, Ur Negative Negative  WET PREP FOR TRICH, YEAST, CLUE     Status: None   Collection Time: 05/08/20  4:41 PM  Result Value Ref Range   Trichomonas Exam Negative Negative   Yeast Exam Negative Negative   Clue Cell Exam Comment: Negative    Comment: POS;AMINE POS  Lipid panel     Status: Abnormal   Collection Time: 07/01/20  3:50 PM  Result Value Ref Range   Cholesterol, Total 279 (H) 100 - 199 mg/dL  Triglycerides 83 0 - 149 mg/dL   HDL 73 >39 mg/dL   VLDL Cholesterol Cal 14 5 - 40 mg/dL   LDL Chol Calc (NIH) 192 (H) 0 - 99 mg/dL   Chol/HDL Ratio 3.8 0.0 - 4.4 ratio    Comment:                                   T. Chol/HDL Ratio                                             Men  Women                               1/2 Avg.Risk  3.4    3.3                                   Avg.Risk  5.0    4.4                                2X Avg.Risk  9.6    7.1                                3X Avg.Risk 23.4   11.0   Comprehensive Metabolic Panel (CMET)     Status: Abnormal   Collection Time: 07/01/20  3:50 PM  Result Value Ref Range   Glucose 106 (H) 65 - 99 mg/dL   BUN 13 6 - 24 mg/dL   Creatinine, Ser 0.88 0.57 - 1.00 mg/dL   GFR calc non Af Amer 79 >59 mL/min/1.73   GFR calc Af Amer 91 >59 mL/min/1.73    Comment: **[facility] currently reports eGFR in compliance with the current**   recommendations of the [employer]. [facility] will   update reporting as new guidelines are published from the NKF-ASN   Task force.    BUN/Creatinine Ratio 15 9 - 23   Sodium 138 134 - 144 mmol/L   Potassium 4.5 3.5 - 5.2 mmol/L   Chloride 104 96 - 106 mmol/L   CO2 21 20 - 29 mmol/L   Calcium 8.9 8.7 - 10.2 mg/dL   Total Protein 6.4 6.0 - 8.5 g/dL    Albumin 3.9 3.8 - 4.8 g/dL   Globulin, Total 2.5 1.5 - 4.5 g/dL   Albumin/Globulin Ratio 1.6 1.2 - 2.2   Bilirubin Total <0.2 0.0 - 1.2 mg/dL   Alkaline Phosphatase 57 48 - 121 IU/L   AST 16 0 - 40 IU/L   ALT 12 0 - 32 IU/L  CBC w/Diff/Platelet     Status: None   Collection Time: 07/01/20  3:50 PM  Result Value Ref Range   WBC 5.4 3.4 - 10.8 x10E3/uL   RBC 4.27 3.77 - 5.28 x10E6/uL   Hemoglobin 13.2 11.1 - 15.9 g/dL   Hematocrit 39.1 34.0 - 46.6 %   MCV 92 79 - 97 fL   MCH 30.9 26.6 - 33.0 pg   MCHC 33.8 31 - 35 g/dL   RDW 12.3 11.7 - 15.4 %  Platelets 230 150 - 450 x10E3/uL   Neutrophils 56 Not Estab. %   Lymphs 34 Not Estab. %   Monocytes 9 Not Estab. %   Eos 1 Not Estab. %   Basos 0 Not Estab. %   Neutrophils Absolute 3.0 1 - 7 x10E3/uL   Lymphocytes Absolute 1.9 0 - 3 x10E3/uL   Monocytes Absolute 0.5 0 - 0 x10E3/uL   EOS (ABSOLUTE) 0.1 0.0 - 0.4 x10E3/uL   Basophils Absolute 0.0 0 - 0 x10E3/uL   Immature Granulocytes 0 Not Estab. %   Immature Grans (Abs) 0.0 0.0 - 0.1 x10E3/uL  Hemoglobin A1C     Status: None   Collection Time: 07/01/20  3:50 PM  Result Value Ref Range   Hgb A1c MFr Bld 5.5 4.8 - 5.6 %    Comment:          Prediabetes: 5.7 - 6.4          Diabetes: >6.4          Glycemic control for adults with diabetes: <7.0    Est. average glucose Bld gHb Est-mCnc 111 mg/dL  Vitamin D (25 hydroxy)     Status: None   Collection Time: 07/01/20  3:50 PM  Result Value Ref Range   Vit D, 25-Hydroxy 32.2 30.0 - 100.0 ng/mL    Comment: Vitamin D deficiency has been defined by the Institute of Medicine and an Endocrine Society practice guideline as a level of serum 25-OH vitamin D less than 20 ng/mL (1,2). The Endocrine Society went on to further define vitamin D insufficiency as a level between 21 and 29 ng/mL (2). 1. IOM (Institute of Medicine). 2010. Dietary reference    intakes for calcium and D. [place]: The    [employer]. 2. Holick MF,  Binkley [place], Bischoff-Ferrari HA, et al.    Evaluation, treatment, and prevention of vitamin D    deficiency: an Endocrine Society clinical practice    guideline. JCEM. 2011 Jul; 96(7):1911-30.   TSH     Status: None   Collection Time: 07/01/20  3:50 PM  Result Value Ref Range   TSH 0.986 0.450 - 4.500 uIU/mL     PHQ2/9: Depression screen [facility] 2/9 07/02/2020 05/08/2020 03/12/2020 03/12/2020 09/30/2019  Decreased Interest 0 0 0 0 0  Down, Depressed, Hopeless 0 0 0 0 0  PHQ - 2 Score 0 0 0 0 0  Altered sleeping 0 - 0 0 0  Tired, decreased energy 0 - 0 0 0  Change in appetite 0 - 0 0 0  Feeling bad or failure about yourself  0 - 0 0 0  Trouble concentrating 0 - 0 0 0  Moving slowly or fidgety/restless 0 - 0 0 0  Suicidal thoughts 0 - 0 0 0  PHQ-9 Score 0 - 0 0 0  Difficult doing work/chores - - Not difficult at all Not difficult at all Not difficult at all    phq 9 is negative   Fall Risk: Fall Risk  07/02/2020 03/12/2020 03/12/2020 09/13/2019 03/28/2019  Falls in the past year? 0 0 0 0 0  Comment - - - - -  Number falls in past yr: 0 0 0 0 0  Injury with Fall? 0 0 0 0 0  Comment - - - - -  Follow up - Falls evaluation completed Falls evaluation completed - -    Functional Status Survey: Is the patient deaf or have difficulty hearing?: No Does the patient have difficulty  seeing, even when wearing glasses/contacts?: No Does the patient have difficulty concentrating, remembering, or making decisions?: No Does the patient have difficulty walking or climbing stairs?: No Does the patient have difficulty dressing or bathing?: No Does the patient have difficulty doing errands alone such as visiting a doctor's office or shopping?: No    Assessment & Plan  1. Pure hypercholesterolemia  - rosuvastatin (CRESTOR) 10 MG tablet; Take 1 tablet (10 mg total) by mouth daily.  Dispense: 90 tablet; Refill: 1  2. Atypical chest pain  - EKG 12-Lead - normal sinus rhythm - Ambulatory referral to  Cardiology : she is very concerned and would like to see cardiologist  3. Chronic tension-type headache, not intractable  She does not want medication   4. Costochondral chest pain  - diclofenac (VOLTAREN) 75 MG EC tablet; Take 1 tablet (75 mg total) by mouth 2 (two) times daily.  Dispense: 30 tablet; Refill: 0

## 2020-07-02 NOTE — Patient Instructions (Signed)
Costochondritis  Costochondritis is swelling and irritation (inflammation) of the tissue (cartilage) that connects your ribs to your breastbone (sternum). This causes pain in the front of your chest. The pain usually starts gradually and involves more than one rib. What are the causes? The exact cause of this condition is not always known. It results from stress on the cartilage where your ribs attach to your sternum. The cause of this stress could be:  Chest injury (trauma).  Exercise or activity, such as lifting.  Severe coughing. What increases the risk? You may be at higher risk for this condition if you:  Are female.  Are 30?47 years old.  Recently started a new exercise or work activity.  Have low levels of vitamin D.  Have a condition that makes you cough frequently. What are the signs or symptoms? The main symptom of this condition is chest pain. The pain:  Usually starts gradually and can be sharp or dull.  Gets worse with deep breathing, coughing, or exercise.  Gets better with rest.  May be worse when you press on the sternum-rib connection (tenderness). How is this diagnosed? This condition is diagnosed based on your symptoms, medical history, and a physical exam. Your health care provider will check for tenderness when pressing on your sternum. This is the most important finding. You may also have tests to rule out other causes of chest pain. These may include:  A chest X-ray to check for lung problems.  An electrocardiogram (ECG) to see if you have a heart problem that could be causing the pain.  An imaging scan to rule out a chest or rib fracture. How is this treated? This condition usually goes away on its own over time. Your health care provider may prescribe an NSAID to reduce pain and inflammation. Your health care provider may also suggest that you:  Rest and avoid activities that make pain worse.  Apply heat or cold to the area to reduce pain and  inflammation.  Do exercises to stretch your chest muscles. If these treatments do not help, your health care provider may inject a numbing medicine at the sternum-rib connection to help relieve the pain. Follow these instructions at home:  Avoid activities that make pain worse. This includes any activities that use chest, abdominal, and side muscles.  If directed, put ice on the painful area: ? Put ice in a plastic bag. ? Place a towel between your skin and the bag. ? Leave the ice on for 20 minutes, 2-3 times a day.  If directed, apply heat to the affected area as often as told by your health care provider. Use the heat source that your health care provider recommends, such as a moist heat pack or a heating pad. ? Place a towel between your skin and the heat source. ? Leave the heat on for 20-30 minutes. ? Remove the heat if your skin turns bright red. This is especially important if you are unable to feel pain, heat, or cold. You may have a greater risk of getting burned.  Take over-the-counter and prescription medicines only as told by your health care provider.  Return to your normal activities as told by your health care provider. Ask your health care provider what activities are safe for you.  Keep all follow-up visits as told by your health care provider. This is important. Contact a health care provider if:  You have chills or a fever.  Your pain does not go away or it gets   worse.  You have a cough that does not go away (is persistent). Get help right away if:  You have shortness of breath. This information is not intended to replace advice given to you by your health care provider. Make sure you discuss any questions you have with your health care provider. Document Revised: 12/06/2017 Document Reviewed: 03/16/2016 Elsevier Patient Education  2020 [employer]  

## 2020-07-05 DIAGNOSIS — Z419 Encounter for procedure for purposes other than remedying health state, unspecified: Secondary | ICD-10-CM | POA: Diagnosis not present

## 2020-07-10 ENCOUNTER — Telehealth: Payer: Self-pay

## 2020-07-10 ENCOUNTER — Other Ambulatory Visit: Payer: Self-pay | Admitting: Family Medicine

## 2020-07-10 DIAGNOSIS — E7800 Pure hypercholesterolemia, unspecified: Secondary | ICD-10-CM

## 2020-07-10 NOTE — Telephone Encounter (Signed)
-----   Message from Alba Cory, MD sent at 07/09/2020  9:36 PM EDT ----- Please contact patient , we need to consider stopping ocp and or increase dose of statin therapy to get her LDL lower  Hi Cynthia Woods,   Lipid panel is out of control, dangerous zone with LDL above 160, are you taking Rosuvastatin daily?  Glucose is the pre-diabetes range, normal kidney and liver function tests Normal CBC, A1C Normal Vitamin D and TSH Take care,   Dr. Carlynn Purl

## 2020-07-11 ENCOUNTER — Encounter: Payer: Self-pay | Admitting: Family Medicine

## 2020-07-17 ENCOUNTER — Encounter: Payer: Self-pay | Admitting: Family Medicine

## 2020-07-28 ENCOUNTER — Encounter: Payer: Medicaid Other | Admitting: Family Medicine

## 2020-08-03 ENCOUNTER — Encounter: Payer: Self-pay | Admitting: Cardiology

## 2020-08-03 ENCOUNTER — Ambulatory Visit (INDEPENDENT_AMBULATORY_CARE_PROVIDER_SITE_OTHER): Payer: BC Managed Care – PPO | Admitting: Cardiology

## 2020-08-03 ENCOUNTER — Other Ambulatory Visit: Payer: Self-pay

## 2020-08-03 ENCOUNTER — Ambulatory Visit: Payer: Medicaid Other | Admitting: Dietician

## 2020-08-03 VITALS — BP 120/78 | HR 84 | Ht 64.0 in | Wt 162.8 lb

## 2020-08-03 DIAGNOSIS — R079 Chest pain, unspecified: Secondary | ICD-10-CM

## 2020-08-03 DIAGNOSIS — E7800 Pure hypercholesterolemia, unspecified: Secondary | ICD-10-CM

## 2020-08-03 NOTE — Addendum Note (Signed)
Addended by: Theola Sequin on: 08/03/2020 04:28 PM   Modules accepted: Orders

## 2020-08-03 NOTE — Patient Instructions (Signed)

## 2020-08-03 NOTE — Progress Notes (Signed)
Cardiology Office Note:    Date:  08/03/2020   ID:  Cynthia Woods, DOB 09-25-1971, MRN [id number]  PCP:  Alba Cory, MD  [facility] Cardiologist:  Debbe Odea, MD  [facility] Electrophysiologist:  None   Referring MD: Alba Cory, MD   Chief Complaint  Patient presents with  . New Patient (Initial Visit)    Establish care for chest pains. Medications verbally reviewed with patient.    Cynthia Woods is a 49 y.o. female who is being seen today for the evaluation of chest pain at the request of Alba Cory, MD.   History of Present Illness:    Cynthia Woods is a 49 y.o. female with a hx of hyperlipidemia who presents due to chest pain.  Patient states having symptoms of chest discomfort for over 4 months now.  Symptoms usually occur/worst when she works out.  Exercises by walking on a treadmill, lifts weights.  She occasionally has symptoms at rest which she rates as 6 out of 10 in severity, sharp in nature.  She can sometimes feel the discomfort when she presses on her chest.  Denies any history of heart disease.  Recently started on medications for hyperlipidemia.  Denies any family history of heart disease.  Denies palpitations, shortness of breath, syncope.  Past Medical History:  Diagnosis Date  . Hyperlipidemia     Past Surgical History:  Procedure Laterality Date  . COLONOSCOPY WITH PROPOFOL N/A 11/27/2019   Procedure: COLONOSCOPY WITH PROPOFOL;  Surgeon: Earline Mayotte, MD;  Location: [facility] ENDOSCOPY;  Service: Endoscopy;  Laterality: N/A;  . FOOT SURGERY  2009  . GYNECOLOGIC CRYOSURGERY  1993  . WISDOM TOOTH EXTRACTION  2011    Current Medications: No outpatient medications have been marked as taking for the 08/03/20 encounter (Office Visit) with Debbe Odea, MD.     Allergies:   Shellfish allergy, Naproxen, and Latex   Social History   Socioeconomic History  . Marital status: Single    Spouse name: Not  on file  . Number of children: 3  . Years of education: Not on file  . Highest education level: Not on file  Occupational History    Comment: part   Tobacco Use  . Smoking status: Never Smoker  . Smokeless tobacco: Never Used  Vaping Use  . Vaping Use: Never used  Substance and Sexual Activity  . Alcohol use: Yes    Comment: 1x/wk  . Drug use: No  . Sexual activity: Not Currently    Partners: Male    Birth control/protection: Pill  Other Topics Concern  . Not on file  Social History Narrative   She used to work at [employer] but had to quit after she settled her [employer]'s comp claim in August 2019    Currently working at [employer] job also has a [employer] part time job    She has 3 children at home   Social Determinants of Corporate investment banker Strain:   . Difficulty of Paying Living Expenses: Not on file  Food Insecurity:   . Worried About Programme researcher, broadcasting/film/video in the Last Year: Not on file  . Ran Out of Food in the Last Year: Not on file  Transportation Needs:   . Lack of Transportation (Medical): Not on file  . Lack of Transportation (Non-Medical): Not on file  Physical Activity:   . Days of Exercise per Week: Not on file  . Minutes of Exercise per Session: Not on  file  Stress:   . Feeling of Stress : Not on file  Social Connections:   . Frequency of Communication with Friends and Family: Not on file  . Frequency of Social Gatherings with Friends and Family: Not on file  . Attends Religious Services: Not on file  . Active Member of Clubs or Organizations: Not on file  . Attends Banker Meetings: Not on file  . Marital Status: Not on file     Family History: The patient's family history includes Cancer in her maternal grandmother and paternal grandfather; Diabetes in her maternal grandfather; Hypertension in her paternal grandmother; Other in her maternal aunt, maternal grandmother, and mother.  ROS:   Please see the history of present illness.      All other systems reviewed and are negative.  EKGs/Labs/Other Studies Reviewed:    The following studies were reviewed today:   EKG:  EKG is  ordered today.  The ekg ordered today demonstrates normal sinus rhythm, normal ECG.  Recent Labs: 07/01/2020: ALT 12; BUN 13; Creatinine, Ser 0.88; Hemoglobin 13.2; Platelets 230; Potassium 4.5; Sodium 138; TSH 0.986  Recent Lipid Panel    Component Value Date/Time   CHOL 279 (H) 07/01/2020 1550   TRIG 83 07/01/2020 1550   HDL 73 07/01/2020 1550   CHOLHDL 3.8 07/01/2020 1550   LDLCALC 192 (H) 07/01/2020 1550    Physical Exam:    VS:  BP 120/78 (BP Location: Right Arm, Patient Position: Sitting, Cuff Size: Normal)   Pulse 84   Ht 5\' 4"  (1.626 m)   Wt 162 lb 12.8 oz (73.8 kg)   SpO2 98%   BMI 27.94 kg/m     Wt Readings from Last 3 Encounters:  08/03/20 162 lb 12.8 oz (73.8 kg)  07/02/20 160 lb 14.4 oz (73 kg)  05/08/20 163 lb (73.9 kg)     GEN:  Well nourished, well developed in no acute distress HEENT: Normal NECK: No JVD; No carotid bruits LYMPHATICS: No lymphadenopathy CARDIAC: RRR, no murmurs, rubs, gallops RESPIRATORY:  Clear to auscultation without rales, wheezing or rhonchi  ABDOMEN: Soft, non-tender, non-distended MUSCULOSKELETAL: Tenderness to palpation around the lower sternum SKIN: Warm and dry NEUROLOGIC:  Alert and oriented x 3 PSYCHIATRIC:  Normal affect   ASSESSMENT:    1. Chest pain of uncertain etiology   2. Pure hypercholesterolemia    PLAN:    In order of problems listed above:  1. Patient with chest pain, atypical, noncardiac.  Symptoms of chest discomfort reproducible with palpation along the lower sternal border/epigastrium.  Findings consistent with noncardiac etiology/musculoskeletal.  Recommend trial of nonsteroidals versus following injection if deemed appropriate by primary care physician.  No indication for cardiac testing at this point saying symptoms are not consistent with a cardiac  etiology. 2. History of hyperlipidemia, recently started on statin.  Continue statin.  Follow-up as needed  This note was generated in part or whole with voice recognition software. Voice recognition is usually quite accurate but there are transcription errors that can and very often do occur. I apologize for any typographical errors that were not detected and corrected.  Medication Adjustments/Labs and Tests Ordered: Current medicines are reviewed at length with the patient today.  Concerns regarding medicines are outlined above.  No orders of the defined types were placed in this encounter.  No orders of the defined types were placed in this encounter.   Patient Instructions  Medication Instructions:  Your physician recommends that you continue on your current  medications as directed. Please refer to the Current Medication list given to you today.  *If you need a refill on your cardiac medications before your next appointment, please call your pharmacy*   Lab Work: None Ordered If you have labs (blood work) drawn today and your tests are completely normal, you will receive your results only by: Marland Kitchen MyChart Message (if you have MyChart) OR . A paper copy in the mail If you have any lab test that is abnormal or we need to change your treatment, we will call you to review the results.   Testing/Procedures: None Ordered   Follow-Up: At [facility], you and your health needs are our priority.  As part of our continuing mission to provide you with exceptional heart care, we have created designated Provider Care Teams.  These Care Teams include your primary Cardiologist (physician) and Advanced Practice Providers (APPs -  Physician Assistants and Nurse Practitioners) who all work together to provide you with the care you need, when you need it.  We recommend signing up for the patient portal called "MyChart".  Sign up information is provided on this After Visit Summary.  MyChart is  used to connect with patients for Virtual Visits (Telemedicine).  Patients are able to view lab/test results, encounter notes, upcoming appointments, etc.  Non-urgent messages can be sent to your provider as well.   To learn more about what you can do with MyChart, go to [email or web address].    Your next appointment:   Follow up as needed   The format for your next appointment:   In Person  Provider:   Debbe Odea, MD   Other Instructions      Signed, Debbe Odea, MD  08/03/2020 1:16 PM    [facility]

## 2020-08-05 DIAGNOSIS — Z419 Encounter for procedure for purposes other than remedying health state, unspecified: Secondary | ICD-10-CM | POA: Diagnosis not present

## 2020-08-31 ENCOUNTER — Other Ambulatory Visit: Payer: Self-pay | Admitting: Family Medicine

## 2020-08-31 DIAGNOSIS — E7800 Pure hypercholesterolemia, unspecified: Secondary | ICD-10-CM

## 2020-08-31 NOTE — Progress Notes (Signed)
Cancelled the day of visit

## 2020-09-01 ENCOUNTER — Other Ambulatory Visit: Payer: Self-pay

## 2020-09-01 ENCOUNTER — Encounter: Payer: BC Managed Care – PPO | Admitting: Family Medicine

## 2020-09-02 LAB — HEPATIC FUNCTION PANEL
AG Ratio: 1.4 (calc) (ref 1.0–2.5)
ALT: 9 U/L (ref 6–29)
AST: 15 U/L (ref 10–35)
Albumin: 3.7 g/dL (ref 3.6–5.1)
Alkaline phosphatase (APISO): 56 U/L (ref 31–125)
Bilirubin, Direct: 0.1 mg/dL (ref 0.0–0.2)
Globulin: 2.7 g/dL (calc) (ref 1.9–3.7)
Indirect Bilirubin: 0.4 mg/dL (calc) (ref 0.2–1.2)
Total Bilirubin: 0.5 mg/dL (ref 0.2–1.2)
Total Protein: 6.4 g/dL (ref 6.1–8.1)

## 2020-09-02 LAB — LIPID PANEL
Cholesterol: 195 mg/dL (ref <200)
HDL: 79 mg/dL (ref >=50)
LDL Cholesterol (Calc): 96 mg/dL (calc)
Non-HDL Cholesterol (Calc): 116 mg/dL (calc) (ref <130)
Total CHOL/HDL Ratio: 2.5 (calc) (ref <5.0)
Triglycerides: 104 mg/dL (ref <150)

## 2020-09-02 NOTE — Progress Notes (Deleted)
Name: Cynthia Woods   MRN: [id number]    DOB: 1971/09/27   Date:09/02/2020       Progress Note  Subjective:    Chief Complaint  No chief complaint on file.   I connected with  Cynthia Woods  on 09/02/20 at  1:40 PM EDT by a video enabled telemedicine application and verified that I am speaking with the correct person using two identifiers.  I discussed the limitations of evaluation and management by telemedicine and the availability of in person appointments. The patient expressed understanding and agreed to proceed. Staff also discussed with the patient that there may be a patient responsible charge related to this service. Patient Location: *** Provider Location: *** Additional Individuals present: ***  HPI   Patient Active Problem List   Diagnosis Date Noted  . Tension headache, chronic 07/03/2018  . Migraine without aura and without status migrainosus, not intractable 07/03/2018  . Shoulder pain, right 05/02/2018  . Cervical radiculopathy 07/20/2017  . Breast mass, right 09/09/2014  . Benign breast cyst in female 09/09/2014    Social History   Tobacco Use  . Smoking status: Never Smoker  . Smokeless tobacco: Never Used  Substance Use Topics  . Alcohol use: Yes    Comment: 1x/wk     Current Outpatient Medications:  .  cholecalciferol (VITAMIN D) 1000 units tablet, Take 1,000 Units by mouth daily., Disp: , Rfl:  .  desogestrel-ethinyl estradiol (KARIVA) 0.15-0.02/0.01 MG (21/5) tablet, Take 1 tablet by mouth daily., Disp: 84 tablet, Rfl: 3 .  diclofenac (VOLTAREN) 75 MG EC tablet, Take 1 tablet (75 mg total) by mouth 2 (two) times daily. (Patient not taking: Reported on 09/01/2020), Disp: 30 tablet, Rfl: 0 .  Multiple Vitamins-Minerals (WOMENS MULTIVITAMIN PO), Take by mouth., Disp: , Rfl:  .  OVER THE COUNTER MEDICATION, Take 2 each by mouth daily. Goli Apple Cider Vinegar Gummies, Disp: , Rfl:  .  rosuvastatin (CRESTOR) 10 MG tablet, Take 1 tablet  (10 mg total) by mouth daily., Disp: 90 tablet, Rfl: 1  Allergies  Allergen Reactions  . Shellfish Allergy Anaphylaxis  . Sulfa Antibiotics Anaphylaxis  . Naproxen Diarrhea  . Latex Rash    ***  Review of Systems    Objective:   Virtual encounter, vitals limited, only able to obtain the following There were no vitals filed for this visit. There is no height or weight on file to calculate BMI. Nursing Note and Vital Signs reviewed.  Physical Exam  PE limited by telephone encounter  Results for orders placed or performed in visit on 08/31/20 (from the past 72 hour(s))  Lipid panel     Status: None   Collection Time: 09/01/20 11:19 AM  Result Value Ref Range   Cholesterol 195 <200 mg/dL   HDL 79 > OR = 50 mg/dL   Triglycerides 998 <338 mg/dL   LDL Cholesterol (Calc) 96 mg/dL (calc)    Comment: Reference range: <100 . Desirable range <100 mg/dL for primary prevention;   <70 mg/dL for patients with CHD or diabetic patients  with > or = 2 CHD risk factors. Marland Kitchen LDL-C is now calculated using the Martin-Hopkins  calculation, which is a validated novel method providing  better accuracy than the Friedewald equation in the  estimation of LDL-C.  Horald Pollen et al. Lenox Ahr. 2505;397(67): 2061-2068  (http://education.[employer].com/faq/FAQ164)    Total CHOL/HDL Ratio 2.5 <5.0 (calc)   Non-HDL Cholesterol (Calc) 116 <130 mg/dL (calc)    Comment: For patients with  diabetes plus 1 major ASCVD risk  factor, treating to a non-HDL-C goal of <100 mg/dL  (LDL-C of <52 mg/dL) is considered a therapeutic  option.   Hepatic function panel     Status: None   Collection Time: 09/01/20 11:19 AM  Result Value Ref Range   Total Protein 6.4 6.1 - 8.1 g/dL   Albumin 3.7 3.6 - 5.1 g/dL   Globulin 2.7 1.9 - 3.7 g/dL (calc)   AG Ratio 1.4 1.0 - 2.5 (calc)   Total Bilirubin 0.5 0.2 - 1.2 mg/dL   Bilirubin, Direct 0.1 0.0 - 0.2 mg/dL   Indirect Bilirubin 0.4 0.2 - 1.2 mg/dL (calc)    Alkaline phosphatase (APISO) 56 31 - 125 U/L   AST 15 10 - 35 U/L   ALT 9 6 - 29 U/L    Assessment and Plan:   No diagnosis found.   -Red flags and when to present for emergency care or RTC including fever >101.57F, chest pain, shortness of breath, new/worsening/un-resolving symptoms, reviewed with patient at time of visit. Follow up and care instructions discussed and provided in AVS. - I discussed the assessment and treatment plan with the patient. The patient was provided an opportunity to ask questions and all were answered. The patient agreed with the plan and demonstrated an understanding of the instructions.  I provided *** minutes of non-face-to-face time during this encounter.  Marcos Eke, CMA 09/02/20 2:53 PM

## 2020-09-03 ENCOUNTER — Other Ambulatory Visit: Payer: Self-pay

## 2020-09-03 ENCOUNTER — Telehealth: Payer: BC Managed Care – PPO | Admitting: Family Medicine

## 2020-09-04 DIAGNOSIS — Z419 Encounter for procedure for purposes other than remedying health state, unspecified: Secondary | ICD-10-CM | POA: Diagnosis not present

## 2020-09-08 ENCOUNTER — Other Ambulatory Visit: Payer: Self-pay

## 2020-09-08 ENCOUNTER — Encounter: Payer: Self-pay | Admitting: Physician Assistant

## 2020-09-08 ENCOUNTER — Ambulatory Visit: Payer: Medicaid Other | Admitting: Physician Assistant

## 2020-09-08 DIAGNOSIS — Z113 Encounter for screening for infections with a predominantly sexual mode of transmission: Secondary | ICD-10-CM

## 2020-09-08 DIAGNOSIS — B373 Candidiasis of vulva and vagina: Secondary | ICD-10-CM

## 2020-09-08 DIAGNOSIS — B3731 Acute candidiasis of vulva and vagina: Secondary | ICD-10-CM

## 2020-09-08 LAB — WET PREP FOR TRICH, YEAST, CLUE: Trichomonas Exam: NEGATIVE

## 2020-09-08 MED ORDER — CLOTRIMAZOLE 1 % VA CREA: 1.0000 | TOPICAL_CREAM | Freq: Every day | VAGINAL | 0 refills | Status: AC

## 2020-09-08 NOTE — Progress Notes (Signed)
[employer] results reviewed by provider C. [place], [place]. Patient treated for yeast per provider and standing orders. Tawny Hopping, RN

## 2020-09-08 NOTE — Progress Notes (Signed)
  [facility] Department STI clinic/screening visit  Subjective:  Cynthia Woods is a 49 y.o. female being seen today for an STI screening visit. The patient reports they do have symptoms.  Patient reports that they do not desire a pregnancy in the next year.   They reported they are not interested in discussing contraception today.  No LMP recorded.   Patient has the following medical conditions:   Patient Active Problem List   Diagnosis Date Noted  . Tension headache, chronic 07/03/2018  . Migraine without aura and without status migrainosus, not intractable 07/03/2018  . Shoulder pain, right 05/02/2018  . Cervical radiculopathy 07/20/2017  . Breast mass, right 09/09/2014  . Benign breast cyst in female 09/09/2014    Chief Complaint  Patient presents with  . SEXUALLY TRANSMITTED DISEASE    Screening    HPI  Patient reports that she has had a white discharge for about 2 weeks.  Denies other symptoms.  Reports that she takes medicine for high cholesterol and her OCs.  Reports her last HIV test was 2 weeks ago with her PCP and last pap was 05/2020.  See flowsheet for further details and programmatic requirements.    The following portions of the patient's history were reviewed and updated as appropriate: allergies, current medications, past medical history, past social history, past surgical history and problem list.  Objective:  There were no vitals filed for this visit.  Physical Exam Constitutional:      General: She is not in acute distress.    Appearance: Normal appearance.  HENT:     Head: Normocephalic and atraumatic.     Comments: No nits,lice, or hair loss. No cervical, supraclavicular or axillary adenopathy. Eyes:     Conjunctiva/sclera: Conjunctivae normal.  Pulmonary:     Effort: Pulmonary effort is normal.  Skin:    General: Skin is warm and dry.     Findings: No bruising, erythema, lesion or rash.  Neurological:     Mental Status: She  is alert and oriented to person, place, and time.  Psychiatric:        Mood and Affect: Mood normal.        Thought Content: Thought content normal.        Judgment: Judgment normal.      Assessment and Plan:  Cynthia Woods is a 49 y.o. female presenting to the [facility] Department for STI screening  1. Screening for STD (sexually transmitted disease) Patient into clinic with symptoms. Patient declines blood work today. Patient opts to self collect vaginal samples for GC/Chlamydia and wet mount today.  Counseled patient how to collect for accurate results.  Rec condoms with all sex. Await test results.  Counseled that RN will call if needs to RTC for treatment once results are back. - WET PREP FOR TRICH, YEAST, CLUE - Chlamydia/Gonorrhea Evaro Lab  2. Candida vaginitis Reviewed wet mount results and will treat for yeast with Clotrimazole 1% vaginal cream 1 app qhs for 7 days. No sex for 10 days. - clotrimazole (CLOTRIMAZOLE-7) 1 % vaginal cream; Place 1 Applicatorful vaginally at bedtime for 7 days.  Dispense: 45 g; Refill: 0     No follow-ups on file.  No future appointments.  Matt Holmes, PA

## 2020-09-29 NOTE — Progress Notes (Signed)
Patient ID: Cynthia Woods, female    DOB: Feb 17, 1971, 49 y.o.   MRN: [id number]  PCP: Cynthia Cory, MD  Chief Complaint  Patient presents with  . Hand Pain    Right thumb    Subjective:   Cynthia Woods is a 49 y.o. female, presents to clinic with CC of the following:  Chief Complaint  Patient presents with  . Hand Pain    Right thumb    HPI:  Patient is a 49 year old female patient of Cynthia Woods Last visit with her was 07/02/2020 Presents today with thumb pain  She noted about a week ago, she hit her thumb on a chair she was walking by, and had pain after.  She felt it right at the DIP joint.  A small raised area of swelling developed, like a welt as she described, and she continued to do things around the house, and at work, including typing, and the discomfort has increased.  It was worse over the weekend, and has increased earlier this week.  It hurts to move the IP joint of the thumb, and it hurts more on the radial side than ulnar side with pressing.  She denies marked redness, and no fevers. She took an 800 mg ibuprofen yesterday, and noted it did not help much. She states when taking a hot shower, warm water helps it feel better acutely.  She has not applied any cold topically. She denies any numbness or tingling in the hand or thumb  Patient Active Problem List   Diagnosis Date Noted  . Tension headache, chronic 07/03/2018  . Migraine without aura and without status migrainosus, not intractable 07/03/2018  . Shoulder pain, right 05/02/2018  . Cervical radiculopathy 07/20/2017  . Breast mass, right 09/09/2014  . Benign breast cyst in female 09/09/2014      Current Outpatient Medications:  .  cholecalciferol (VITAMIN D) 1000 units tablet, Take 1,000 Units by mouth daily., Disp: , Rfl:  .  desogestrel-ethinyl estradiol (KARIVA) 0.15-0.02/0.01 MG (21/5) tablet, Take 1 tablet by mouth daily., Disp: 84 tablet, Rfl: 3 .  diclofenac (VOLTAREN) 75  MG EC tablet, Take 1 tablet (75 mg total) by mouth 2 (two) times daily., Disp: 30 tablet, Rfl: 0 .  Multiple Vitamins-Minerals (WOMENS MULTIVITAMIN PO), Take by mouth., Disp: , Rfl:  .  OVER THE COUNTER MEDICATION, Take 2 each by mouth daily. Goli Apple Cider Vinegar Gummies, Disp: , Rfl:  .  rosuvastatin (CRESTOR) 10 MG tablet, Take 1 tablet (10 mg total) by mouth daily., Disp: 90 tablet, Rfl: 1   Allergies  Allergen Reactions  . Shellfish Allergy Anaphylaxis  . Sulfa Antibiotics Anaphylaxis  . Naproxen Diarrhea  . Latex Rash     Past Surgical History:  Procedure Laterality Date  . COLONOSCOPY WITH PROPOFOL N/A 11/27/2019   Procedure: COLONOSCOPY WITH PROPOFOL;  Surgeon: Earline Mayotte, MD;  Location: [facility] ENDOSCOPY;  Service: Endoscopy;  Laterality: N/A;  . FOOT SURGERY  2009  . GYNECOLOGIC CRYOSURGERY  1993  . WISDOM TOOTH EXTRACTION  2011     Family History  Problem Relation Age of Onset  . Other Mother        brain tumor  . Cancer Paternal Grandfather        colon  . Other Maternal Aunt        brain tumor  . Other Maternal Grandmother        brain tumor  . Cancer Maternal Grandmother   .  Diabetes Maternal Grandfather   . Hypertension Paternal Grandmother      Social History   Tobacco Use  . Smoking status: Never Smoker  . Smokeless tobacco: Never Used  Substance Use Topics  . Alcohol use: Yes    Comment: 1x/wk    With staff assistance, above reviewed with the patient today.  ROS: As per HPI, otherwise no specific complaints on a limited and focused system review   No results found for this or any previous visit (from the past 72 hour(s)).   PHQ2/9: Depression screen Cynthia Woods 2/9 09/30/2020 09/01/2020 07/02/2020 05/08/2020 03/12/2020  Decreased Interest 0 0 0 0 0  Down, Depressed, Hopeless 0 0 0 0 0  PHQ - 2 Score 0 0 0 0 0  Altered sleeping - - 0 - 0  Tired, decreased energy - - 0 - 0  Change in appetite - - 0 - 0  Feeling bad or failure about yourself   - - 0 - 0  Trouble concentrating - - 0 - 0  Moving slowly or fidgety/restless - - 0 - 0  Suicidal thoughts - - 0 - 0  PHQ-9 Score - - 0 - 0  Difficult doing work/chores - - - - Not difficult at all   PHQ-2/9 Result is neg  Fall Risk: Fall Risk  09/30/2020 09/01/2020 07/02/2020 03/12/2020 03/12/2020  Falls in the past year? 0 0 0 0 0  Comment - - - - -  Number falls in past yr: 0 0 0 0 0  Injury with Fall? 0 0 0 0 0  Comment - - - - -  Follow up - - - Falls evaluation completed Falls evaluation completed      Objective:   Vitals:   09/30/20 0808  BP: 104/66  Pulse: 85  Resp: 16  Temp: 98.6 F (37 C)  TempSrc: Oral  SpO2: 97%  Weight: 162 lb 6.4 oz (73.7 kg)  Height: 5\' 4"  (1.626 m)    Body mass index is 27.88 kg/m.  Physical Exam   NAD, masked, pleasant HEENT - [place]/AT, sclera anicteric Ext -right hand-she was tender to palpation over the IP joint dorsally, much more tender on the radial side than the ulnar side, with a slight raised area of swelling on the radial side dorsally.  No increased erythema, no increased warmth, nontender over the phalanges proximal, nontender at the M CP joint, with good motion of the MCP joint.  Limited motion of the IP joint due to discomfort, although she could slightly flex before limited with pain.  The nail was intact and painted. Sensation intact to light touch over the thumb and hand, Neuro/psychiatric - affect was not flat, appropriate with conversation  Alert with normal speech  Results for orders placed or performed in visit on 09/08/20  WET PREP FOR TRICH, YEAST, CLUE  Result Value Ref Range   Trichomonas Exam Negative Negative   Yeast Exam MOD Negative   Clue Cell Exam Comment: Negative       Assessment & Plan:    1. Thumb pain, right 2. Contusion of right thumb without damage to nail, initial encounter Patient with a prior contusion, and pain after.  Do feel an x-ray is indicated and ordered.  Has evidence of a synovitis,  likely post contusion.  The slightly raised area of swelling not fluctuant, not like an abscess concern, no marked clinical evidence of infection concern presently. Feel best to manage as follows: X-ray ordered and await x-ray results Apply  cold topically at 10 to 15-minute intervals several times a day Can use ibuprofen-800 mg dose up to 3 times a day as needed, take with food Keep splinted in the short-term for protection and until symptoms improved.  The office has no splints to apply, and a tongue blade was used with tape to apply a crude splint presently.  Instructed to continue with a splint for protection, and can get a padded splint at a pharmacy which may be more comfortable and better to help protect. We will be in touch after x-ray results obtained.  If symptoms not improving more problematic, needs to follow-up, and await x-ray results to help with further management as well presently.  - DG Finger Thumb Right; Future       Jamelle Haring, MD 09/30/20 8:33 AM

## 2020-09-30 ENCOUNTER — Other Ambulatory Visit: Payer: Self-pay

## 2020-09-30 ENCOUNTER — Encounter: Payer: Self-pay | Admitting: Internal Medicine

## 2020-09-30 ENCOUNTER — Telehealth: Payer: Self-pay

## 2020-09-30 ENCOUNTER — Ambulatory Visit
Admission: RE | Admit: 2020-09-30 | Discharge: 2020-09-30 | Disposition: A | Discharge status: Home / Self Care | Payer: Medicaid Other | Attending: Internal Medicine | Admitting: Internal Medicine

## 2020-09-30 ENCOUNTER — Ambulatory Visit
Admission: RE | Admit: 2020-09-30 | Discharge: 2020-09-30 | Disposition: A | Discharge status: Home / Self Care | Payer: Medicaid Other | Source: Ambulatory Visit | Attending: Internal Medicine | Admitting: Internal Medicine

## 2020-09-30 ENCOUNTER — Ambulatory Visit: Payer: Medicaid Other | Admitting: Internal Medicine

## 2020-09-30 VITALS — BP 104/66 | HR 85 | Temp 98.6°F | Resp 16 | Ht 64.0 in | Wt 162.4 lb

## 2020-09-30 DIAGNOSIS — M79644 Pain in right finger(s): Secondary | ICD-10-CM | POA: Insufficient documentation

## 2020-09-30 DIAGNOSIS — S60011A Contusion of right thumb without damage to nail, initial encounter: Secondary | ICD-10-CM | POA: Insufficient documentation

## 2020-09-30 NOTE — Telephone Encounter (Signed)
Copied from CRM [id number]. Topic: Quick Communication - See Telephone Encounter >> Sep 30, 2020  2:41 PM Aretta Nip wrote: CRM for notification. See Telephone encounter for: 09/30/20. Pt had thumb x rayed this am to see if broken, she is wanting to know what to do as the splint mentioned is making hurt worse. [phone]. 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 FU please

## 2020-09-30 NOTE — Patient Instructions (Signed)
Apply cold topically at 10 to 15-minute intervals several times a day  Can use ibuprofen-800 mg dose up to 3 times a day as needed, take with food  Keep splinted in the short-term until symptoms improve  Please obtain the x-ray that was ordered today

## 2020-09-30 NOTE — Telephone Encounter (Signed)
Patient stopped by and was given the information and note was also given to patient.  Patient verbalized understanding and has made a follow up appt.

## 2020-09-30 NOTE — Telephone Encounter (Signed)
Still awaiting the x-ray result. The splint is just for protection, and if she is more uncomfortable without a splint, can not use and just ensure is careful to avoid further contusion type injuries. Also, can you provide a note for her stating she was assessed today, and needs to avoid repetitive motion activities with her right thumb for the next 1 to 2 weeks until symptoms have significantly improved. Could you also make a follow-up visit in 10 to 14 days to reassess, and that may need to change after the x-ray results are back, but would like to have that in place presently. Thanks

## 2020-10-05 DIAGNOSIS — Z419 Encounter for procedure for purposes other than remedying health state, unspecified: Secondary | ICD-10-CM | POA: Diagnosis not present

## 2020-10-09 NOTE — Progress Notes (Signed)
Patient ID: Cynthia Woods, female    DOB: [birth year], 49 y.o.   MRN: [id number]  PCP: Alba Cory, MD  Chief Complaint  Patient presents with  . Thumb pain    Subjective:   Cynthia Woods is a 49 y.o. female, presents to clinic with CC of the following:  Chief Complaint  Patient presents with  . Thumb pain    HPI:  Patient is a 49 year old female patient of Dr. Carlynn Purl Follows up after I saw her 09/30/2020 for a thumb injury.  She had a thumb contusion about a week prior, and was still having pain, and an x-ray was obtained as part of the evaluation. The x-ray result was as follows:  IMPRESSION: Tiny bony fragment along the dorsal radial base of the first distal phalanx may reflect a tiny fracture  Communication with her about that result was as follows:  The x-ray showed a very tiny ossification, likely a tiny bony fragment at the base of the distal phalanx which may be consistent with a tiny fracture. I do think this is likely with clinical correlation.  Treating with a small padded splint across the joint of the thumb would be recommended to help protect and for comfort to allow this to heal. Also activity limitations as recommended with a note provided for your work last visit to help as well. We have a follow up planned and will talk about next steps if not improving over time at that visit.   On follow-up today, she notes the thumb has still been painful at times, mostly when touching the thumb or if she bangs it on something.  Has been wearing a padded splint that she had with her today when doing things to help protect.  Still hurts just on that one side of the distal joint of the thumb.  The small swelling has lessened.   She notes her blood pressures have always been controlled, and really has never had problems with higher blood pressures in her past.  She noted yesterday she had headaches much of the day, that did not go away with taking  over-the-counter products like ibuprofen or [employer] product. Still has a mild headache this morning, not marked.  Denies any vision changes, no chest pains or palpitations or shortness of breath.  BP Readings from Last 3 Encounters:  10/12/20 (!) 130/100  09/30/20 104/66  08/03/20 120/78     Patient Active Problem List   Diagnosis Date Noted  . Tension headache, chronic 07/03/2018  . Migraine without aura and without status migrainosus, not intractable 07/03/2018  . Shoulder pain, right 05/02/2018  . Cervical radiculopathy 07/20/2017  . Breast mass, right 09/09/2014  . Benign breast cyst in female 09/09/2014      Current Outpatient Medications:  .  cholecalciferol (VITAMIN D) 1000 units tablet, Take 1,000 Units by mouth daily., Disp: , Rfl:  .  desogestrel-ethinyl estradiol (KARIVA) 0.15-0.02/0.01 MG (21/5) tablet, Take 1 tablet by mouth daily., Disp: 84 tablet, Rfl: 3 .  diclofenac (VOLTAREN) 75 MG EC tablet, Take 1 tablet (75 mg total) by mouth 2 (two) times daily., Disp: 30 tablet, Rfl: 0 .  Multiple Vitamins-Minerals (WOMENS MULTIVITAMIN PO), Take by mouth., Disp: , Rfl:  .  OVER THE COUNTER MEDICATION, Take 2 each by mouth daily. Goli Apple Cider Vinegar Gummies, Disp: , Rfl:  .  rosuvastatin (CRESTOR) 10 MG tablet, Take 1 tablet (10 mg total) by mouth daily., Disp: 90 tablet, Rfl: 1  Allergies  Allergen Reactions  . Shellfish Allergy Anaphylaxis  . Sulfa Antibiotics Anaphylaxis  . Naproxen Diarrhea  . Latex Rash     Past Surgical History:  Procedure Laterality Date  . COLONOSCOPY WITH PROPOFOL N/A 11/27/2019   Procedure: COLONOSCOPY WITH PROPOFOL;  Surgeon: Earline Mayotte, MD;  Location: [facility] ENDOSCOPY;  Service: Endoscopy;  Laterality: N/A;  . FOOT SURGERY  2009  . GYNECOLOGIC CRYOSURGERY  1993  . WISDOM TOOTH EXTRACTION  2011     Family History  Problem Relation Age of Onset  . Other Mother        brain tumor  . Cancer Paternal Grandfather         colon  . Other Maternal Aunt        brain tumor  . Other Maternal Grandmother        brain tumor  . Cancer Maternal Grandmother   . Diabetes Maternal Grandfather   . Hypertension Paternal Grandmother      Social History   Tobacco Use  . Smoking status: Never Smoker  . Smokeless tobacco: Never Used  Substance Use Topics  . Alcohol use: Yes    Comment: 1x/wk    With staff assistance, above reviewed with the patient today.  ROS: As per HPI, otherwise no specific complaints on a limited and focused system review   No results found for this or any previous visit (from the past 72 hour(s)).   PHQ2/9: Depression screen [facility] 2/9 10/12/2020 09/30/2020 09/01/2020 07/02/2020 05/08/2020  Decreased Interest 0 0 0 0 0  Down, Depressed, Hopeless 0 0 0 0 0  PHQ - 2 Score 0 0 0 0 0  Altered sleeping 0 - - 0 -  Tired, decreased energy 0 - - 0 -  Change in appetite 0 - - 0 -  Feeling bad or failure about yourself  0 - - 0 -  Trouble concentrating 0 - - 0 -  Moving slowly or fidgety/restless 0 - - 0 -  Suicidal thoughts 0 - - 0 -  PHQ-9 Score 0 - - 0 -  Difficult doing work/chores - - - - -  Some recent data might be hidden   PHQ-2/9 Result is neg  Fall Risk: Fall Risk  10/12/2020 09/30/2020 09/01/2020 07/02/2020 03/12/2020  Falls in the past year? 0 0 0 0 0  Comment - - - - -  Number falls in past yr: 0 0 0 0 0  Injury with Fall? 0 0 0 0 0  Comment - - - - -  Follow up - - - - Falls evaluation completed      Objective:   Vitals:   10/12/20 0746  BP: (!) 130/100  Pulse: (!) 106  Resp: 16  Temp: 98.2 F (36.8 C)  TempSrc: Oral  Weight: 158 lb 12.8 oz (72 kg)  Height: 5\' 4"  (1.626 m)    Body mass index is 27.26 kg/m. Recheck blood pressure by me was 118/92 on the left with an adult cuff  Physical Exam   NAD, masked, pleasant HEENT - [place]/AT, sclera anicteric, PERRL Car -regular rate and rhythm without murmur, gallop, rub, not tachycardic on my exam with heart rate 96 and  regular Pulm -clear to auscultation Ext -right hand-she remains focally tender to palpation over the IP joint dorsally on the radial side nontender on the ulnar side, and nontender over the proximal phalanx and MCP joint.  The prior slight raised area of swelling on the radial side dorsally was  significantly lessened today. No increased erythema, no increased warmth, she had good motion of the MCP joint without pain when testing range of motion of the IP joint. The nail was intact and painted. Sensation intact to light touch over the thumb and hand, Neuro/psychiatric - affect was not flat, appropriate with conversation             Alert with normal speech  Results for orders placed or performed in visit on 09/08/20  WET PREP FOR TRICH, YEAST, CLUE  Result Value Ref Range   Trichomonas Exam Negative Negative   Yeast Exam MOD Negative   Clue Cell Exam Comment: Negative       Assessment & Plan:   1. Closed displaced fracture of distal phalanx of right thumb with routine healing, subsequent encounter 2. Thumb pain, right 3. Contusion of right thumb without damage to nail, initial encounter  The x-ray obtained after our last visit showed a tiny bone fragment at the base of the first distal phalanx, discussed with patient this is very likely a tiny fracture with the clinical correlation noted. Noted the most important component presently is having good motion of the IP joint without pain, and having full function.  She had good range of motion today without pain, although still uncomfortable palpating the area of the likely tiny fracture. Did feel likely will continue to heal, and did offer her seeing orthopedics, although did not think they would do anything different as I noted to her today.  I did note if a tiny fragment is affecting joint motion, sometimes an effort will be made to try to remove that surgically.  She agreed to proceed as follows Continue to apply cold topically at 10 to  15-minute intervals as needed for soreness or pain. Keep splinted for the next 2 weeks for protection and and allow this to further heal. After 2 weeks, can lessen splint use, and slowly increase activities and assess response.  If pain symptoms increasing again she should return to wearing the splint more regularly. Will again follow-up in approximate 3 weeks time, follow-up sooner if symptoms again worsening.  4.  Elevated blood pressure without a diagnosis of hypertension Noted that pain can increase blood pressure, and may be a reaction to pain, although also noted she had headaches yesterday and high blood pressure can cause headaches.  Also noted the many entities that can cause a acute increase in blood pressure.  The recheck of her blood pressure was better, but still above the desired range as discussed. Felt best not to rush into adding a blood pressure medicine today, and asked she get a few readings on the outside over the next 3 weeks, and she thought that may be possible, and then bring them with her on her follow-up visit. If is having persistent or increasing symptoms, and her blood pressure is higher on outside checks like the first 1 today, should follow-up sooner than the 3 weeks planned.  Schedule a follow-up for 3 weeks time, follow-up sooner as needed.   Jamelle Haring, MD 10/12/20 8:06 AM

## 2020-10-12 ENCOUNTER — Other Ambulatory Visit: Payer: Self-pay

## 2020-10-12 ENCOUNTER — Telehealth: Payer: Self-pay

## 2020-10-12 ENCOUNTER — Encounter: Payer: Self-pay | Admitting: Internal Medicine

## 2020-10-12 ENCOUNTER — Ambulatory Visit: Payer: Medicaid Other | Admitting: Internal Medicine

## 2020-10-12 VITALS — BP 130/100 | HR 106 | Temp 98.2°F | Resp 16 | Ht 64.0 in | Wt 158.8 lb

## 2020-10-12 DIAGNOSIS — R030 Elevated blood-pressure reading, without diagnosis of hypertension: Secondary | ICD-10-CM | POA: Insufficient documentation

## 2020-10-12 DIAGNOSIS — S60011A Contusion of right thumb without damage to nail, initial encounter: Secondary | ICD-10-CM | POA: Diagnosis not present

## 2020-10-12 DIAGNOSIS — S62521D Displaced fracture of distal phalanx of right thumb, subsequent encounter for fracture with routine healing: Secondary | ICD-10-CM | POA: Diagnosis not present

## 2020-10-12 DIAGNOSIS — M79644 Pain in right finger(s): Secondary | ICD-10-CM | POA: Diagnosis not present

## 2020-10-12 DIAGNOSIS — S62521A Displaced fracture of distal phalanx of right thumb, initial encounter for closed fracture: Secondary | ICD-10-CM | POA: Insufficient documentation

## 2020-10-12 NOTE — Telephone Encounter (Signed)
Please let patient know that trying extra strength Tylenol every 4-6 hours, or can try a generic Aleve product (naproxen), can take 2 of the over-the-counter strength naproxen product up to twice daily with food as needed for headache.  If she is nauseous or has an upset stomach, would use the extra strength Tylenol. Thanks,

## 2020-10-12 NOTE — Telephone Encounter (Signed)
Sent message to patients mychart

## 2020-10-12 NOTE — Patient Instructions (Signed)
Please try to have the blood pressure checked a few times over the next 3 weeks if possible and write them down and bring them back on your follow-up visit  Continue to wear the splint for the next 2 weeks, then try to lessen use in that third week and assess.  If having increasing pains again, can return to wearing the splint more routinely.  Can still apply cold topically as needed for discomfort.

## 2020-10-12 NOTE — Telephone Encounter (Signed)
Patient called and says when she was in the office today she had a headache and her BP was 130/100. She says she went to work and the headache was so bad, she had to leave work. She went by [facility] to check her BP and it was 102/84. She wants to know what can she take to get rid of the headache. I advised of the note to check BP several times over the next few weeks and bring in the readings. I advised I will send this note to Dr. Dorris Fetch for review and someone will call with his recommendation, she verbalized understanding.

## 2020-10-15 DIAGNOSIS — N6002 Solitary cyst of left breast: Secondary | ICD-10-CM | POA: Diagnosis not present

## 2020-10-15 DIAGNOSIS — N6001 Solitary cyst of right breast: Secondary | ICD-10-CM | POA: Diagnosis not present

## 2020-10-18 ENCOUNTER — Other Ambulatory Visit: Payer: Self-pay | Admitting: General Surgery

## 2020-10-18 DIAGNOSIS — N6009 Solitary cyst of unspecified breast: Secondary | ICD-10-CM

## 2020-10-19 ENCOUNTER — Ambulatory Visit: Payer: Medicaid Other | Admitting: Family Medicine

## 2020-10-23 ENCOUNTER — Other Ambulatory Visit: Payer: Self-pay

## 2020-10-23 ENCOUNTER — Ambulatory Visit: Payer: Medicaid Other | Admitting: Advanced Practice Midwife

## 2020-10-23 ENCOUNTER — Encounter: Payer: Self-pay | Admitting: Advanced Practice Midwife

## 2020-10-23 DIAGNOSIS — Z113 Encounter for screening for infections with a predominantly sexual mode of transmission: Secondary | ICD-10-CM | POA: Diagnosis not present

## 2020-10-23 NOTE — Progress Notes (Signed)
Wet mount reviewed and is negative today, so no treatment needed for wet mount per standing order. Provider orders completed. 

## 2020-10-23 NOTE — Progress Notes (Signed)
[facility] Department STI clinic/screening visit  Subjective:  Cynthia Woods is a 49 y.o. SBF G7P3 nonsmoking female being seen today for an STI screening visit. The patient reports they do have symptoms.  Patient reports that they do not desire a pregnancy in the next year.   They reported they are not interested in discussing contraception today.  Patient's last menstrual period was 10/08/2020.   Patient has the following medical conditions:   Patient Active Problem List   Diagnosis Date Noted  . Closed displaced fracture of distal phalanx of right thumb 10/12/2020  . Elevated BP without diagnosis of hypertension 10/12/2020  . Tension headache, chronic 07/03/2018  . Migraine without aura and without status migrainosus, not intractable 07/03/2018  . Shoulder pain, right 05/02/2018  . Cervical radiculopathy 07/20/2017  . Breast mass, right 09/09/2014  . Benign breast cyst in female 09/09/2014    No chief complaint on file.   HPI  Patient reports "I treated myself for BV with Metrogel x 3 days the last time I was here.  I see white d/c".  Pt states she felt she had BV 09/08/20 when she was evaluated here for STD's and diagnosed with yeast and treated.  Last sex 10/19/20 with condom; with current partner x 3 years; 1 sex partner in last 3 mo.  Last ETOH 10/21/20 (1 glass vodka)3x/mo.  Last HIV test per patient/review of record was 08/10/11 Patient reports last pap was 05/08/20 neg  See flowsheet for further details and programmatic requirements.    The following portions of the patient's history were reviewed and updated as appropriate: allergies, current medications, past medical history, past social history, past surgical history and problem list.  Objective:  There were no vitals filed for this visit.  Physical Exam Vitals and nursing note reviewed.  Constitutional:      Appearance: Normal appearance.  HENT:     Head: Normocephalic and atraumatic.      Mouth/Throat:     Mouth: Mucous membranes are moist.     Pharynx: Oropharynx is clear. No oropharyngeal exudate or posterior oropharyngeal erythema.  Pulmonary:     Effort: Pulmonary effort is normal.  Abdominal:     Palpations: There is no mass.     Tenderness: There is no abdominal tenderness. There is no rebound.     Comments: Soft without masses or tenderness  Genitourinary:    General: Normal vulva.     Exam position: Lithotomy position.     Pubic Area: No rash or pubic lice.      Labia:        Right: No rash or lesion.        Left: No rash or lesion.      Vagina: Vaginal discharge (thick white leukorrhea, ph<4.5) present. No erythema, bleeding or lesions.     Cervix: Normal.     Uterus: Normal.      Adnexa: Right adnexa normal and left adnexa normal.     Rectum: Normal.  Lymphadenopathy:     Head:     Right side of head: No preauricular or posterior auricular adenopathy.     Left side of head: No preauricular or posterior auricular adenopathy.     Cervical: No cervical adenopathy.     Upper Body:     Right upper body: No supraclavicular or axillary adenopathy.     Left upper body: No supraclavicular or axillary adenopathy.     Lower Body: No right inguinal adenopathy. No left inguinal adenopathy.  Skin:    General: Skin is warm and dry.     Findings: No rash.  Neurological:     Mental Status: She is alert and oriented to person, place, and time.      Assessment and Plan:  Cynthia Woods is a 49 y.o. female presenting to the [facility] Department for STI screening  1. Screening examination for venereal disease Treat wet mount per standing orders Immunization nurse consult - WET PREP FOR TRICH, YEAST, CLUE - Chlamydia/Gonorrhea [facility]     No follow-ups on file.  Future Appointments  Date Time Provider Department Center  11/02/2020  1:20 PM Alba Cory, MD [facility]  11/05/2020  7:40 AM Jamelle Haring, MD [facility]  [facility]  11/19/2020  9:20 AM [facility]  11/19/2020  9:40 AM [facility] [place] [facility]  11/19/2020  9:50 AM [facility] [place] [facility]    Alberteen Spindle, CNM

## 2020-10-24 LAB — WET PREP FOR TRICH, YEAST, CLUE
Trichomonas Exam: NEGATIVE
Yeast Exam: NEGATIVE

## 2020-10-28 LAB — GONOCOCCUS CULTURE

## 2020-11-02 ENCOUNTER — Ambulatory Visit: Payer: Medicaid Other | Admitting: Family Medicine

## 2020-11-04 DIAGNOSIS — Z419 Encounter for procedure for purposes other than remedying health state, unspecified: Secondary | ICD-10-CM | POA: Diagnosis not present

## 2020-11-05 ENCOUNTER — Ambulatory Visit: Payer: Medicaid Other | Admitting: Internal Medicine

## 2020-11-19 ENCOUNTER — Other Ambulatory Visit: Payer: Medicaid Other

## 2020-11-26 ENCOUNTER — Other Ambulatory Visit: Payer: Self-pay | Admitting: Family Medicine

## 2020-11-26 DIAGNOSIS — E7800 Pure hypercholesterolemia, unspecified: Secondary | ICD-10-CM

## 2020-11-26 MED ORDER — ROSUVASTATIN CALCIUM 10 MG PO TABS: 10.0000 mg | ORAL_TABLET | Freq: Every day | ORAL | 1 refills | Status: DC

## 2020-11-26 NOTE — Telephone Encounter (Signed)
Medication:rosuvastatin (CRESTOR) 10 MG tablet [[id number]]  Has the patient contacted their pharmacy? YES  (Agent: If no, request that the patient contact the pharmacy for the refill.) (Agent: If yes, when and what did the pharmacy advise?)  Preferred Pharmacy (with phone number or street name): [id number] [place], [place] - [address]  [address] [place], [place] [address]  Phone:  [phone] Fax:  [phone]   Agent: Please be advised that RX refills may take up to 3 business days. We ask that you follow-up with your pharmacy.

## 2020-12-03 ENCOUNTER — Ambulatory Visit: Payer: Medicaid Other

## 2020-12-03 ENCOUNTER — Ambulatory Visit
Admission: RE | Admit: 2020-12-03 | Discharge: 2020-12-03 | Disposition: A | Discharge status: Home / Self Care | Payer: Medicaid Other | Source: Ambulatory Visit | Attending: General Surgery | Admitting: General Surgery

## 2020-12-03 ENCOUNTER — Other Ambulatory Visit: Payer: Self-pay

## 2020-12-03 DIAGNOSIS — N6009 Solitary cyst of unspecified breast: Secondary | ICD-10-CM | POA: Diagnosis not present

## 2020-12-03 DIAGNOSIS — R922 Inconclusive mammogram: Secondary | ICD-10-CM | POA: Diagnosis not present

## 2020-12-03 DIAGNOSIS — N6311 Unspecified lump in the right breast, upper outer quadrant: Secondary | ICD-10-CM | POA: Diagnosis not present

## 2020-12-05 DIAGNOSIS — Z419 Encounter for procedure for purposes other than remedying health state, unspecified: Secondary | ICD-10-CM | POA: Diagnosis not present

## 2020-12-16 ENCOUNTER — Encounter: Payer: Self-pay | Admitting: Physician Assistant

## 2020-12-16 ENCOUNTER — Other Ambulatory Visit: Payer: Self-pay

## 2020-12-16 ENCOUNTER — Ambulatory Visit: Payer: Medicaid Other | Admitting: Physician Assistant

## 2020-12-16 DIAGNOSIS — Z113 Encounter for screening for infections with a predominantly sexual mode of transmission: Secondary | ICD-10-CM

## 2020-12-16 DIAGNOSIS — N760 Acute vaginitis: Secondary | ICD-10-CM

## 2020-12-16 DIAGNOSIS — B9689 Other specified bacterial agents as the cause of diseases classified elsewhere: Secondary | ICD-10-CM

## 2020-12-16 LAB — WET PREP FOR TRICH, YEAST, CLUE
Trichomonas Exam: NEGATIVE
Yeast Exam: NEGATIVE

## 2020-12-16 MED ORDER — METRONIDAZOLE 500 MG PO TABS: 500.0000 mg | ORAL_TABLET | Freq: Two times a day (BID) | ORAL | 0 refills | Status: AC

## 2020-12-16 NOTE — Progress Notes (Signed)
Pt is here for STI screening. Pt reports LMP was 12/01/2020 but was lighter than normal. Pt reports is no longer on birth control and had unprotected sex 11/13/2020. Pt reports is having some thick discharge.

## 2020-12-16 NOTE — Progress Notes (Signed)
[facility] Department STI clinic/screening visit  Subjective:  Cynthia Woods is a 50 y.o. female being seen today for an STI screening visit. The patient reports they do have symptoms.  Patient reports that they do not desire a pregnancy in the next year.   They reported they are not interested in discussing contraception today.  Patient's last menstrual period was 12/01/2020 (exact date).   Patient has the following medical conditions:   Patient Active Problem List   Diagnosis Date Noted  . Closed displaced fracture of distal phalanx of right thumb 10/12/2020  . Elevated BP without diagnosis of hypertension 10/12/2020  . Tension headache, chronic 07/03/2018  . Migraine without aura and without status migrainosus, not intractable 07/03/2018  . Shoulder pain, right 05/02/2018  . Cervical radiculopathy 07/20/2017  . Breast mass, right 09/09/2014  . Benign breast cyst in female 09/09/2014    Chief Complaint  Patient presents with  . SEXUALLY TRANSMITTED DISEASE    screening    HPI  Patient reports that she has some thick discharge that it "off color" for a few days.  Denies other symptoms and states that she takes her statin for her cholesterol and MVI.  States that she stopped her OCs and her period was different last month.  Reports last HIV test was 09/2020 and last pap was   See flowsheet for further details and programmatic requirements.    The following portions of the patient's history were reviewed and updated as appropriate: allergies, current medications, past medical history, past social history, past surgical history and problem list.  Objective:  There were no vitals filed for this visit.  Physical Exam Constitutional:      General: She is not in acute distress.    Appearance: Normal appearance.  HENT:     Head: Normocephalic and atraumatic.  Eyes:     Conjunctiva/sclera: Conjunctivae normal.  Pulmonary:     Effort: Pulmonary effort is  normal.  Skin:    General: Skin is warm and dry.     Findings: No bruising, erythema, lesion or rash.  Neurological:     Mental Status: She is alert and oriented to person, place, and time.  Psychiatric:        Mood and Affect: Mood normal.        Behavior: Behavior normal.        Thought Content: Thought content normal.        Judgment: Judgment normal.      Assessment and Plan:  Cynthia Woods is a 50 y.o. female presenting to the [facility] Department for STI screening  1. Screening for STD (sexually transmitted disease) Patient into clinic with symptoms. Patient declines blood work today.  Patient requests to self-collect vaginal samples for testing.  Reviewed with patient how to collect for accurate results.  Rec condoms with all sex. Await test results.  Counseled that RN will call if needs to RTC for treatment once results are back. - WET PREP FOR TRICH, YEAST, CLUE - Chlamydia/Gonorrhea [facility]  2. BV (bacterial vaginosis) Treat for BV with Metronidazole 500 mg #14 1 po BID for 7 days with food, no EtOH for 24 hr before and until 72 hr after completing medicine. No sex for 10 days. Enc to use OTC antifungal cream if has itching during or just after treatment with antibiotics. - metroNIDAZOLE (FLAGYL) 500 MG tablet; Take 1 tablet (500 mg total) by mouth 2 (two) times daily for 7 days.  Dispense: 14 tablet; Refill: 0     No follow-ups on file.  No future appointments.  Matt Holmes, PA

## 2020-12-16 NOTE — Progress Notes (Signed)
Wet mount reviewed by provider; pt treated per provider orders with Metronidazole. Provider orders completed.

## 2020-12-28 ENCOUNTER — Telehealth: Payer: Self-pay | Admitting: Family Medicine

## 2020-12-28 DIAGNOSIS — B9689 Other specified bacterial agents as the cause of diseases classified elsewhere: Secondary | ICD-10-CM

## 2020-12-28 MED ORDER — METRONIDAZOLE 500 MG PO TABS: 500.0000 mg | ORAL_TABLET | Freq: Two times a day (BID) | ORAL | 0 refills | Status: AC

## 2020-12-28 NOTE — Telephone Encounter (Signed)
Call from patient stating that she would like the [facility] called in[facility] [address] so that she can pick it up on her way home from work. Counseled patient that I would send the Rx to above pharmacy.

## 2020-12-28 NOTE — Telephone Encounter (Signed)
Call to patient. Patient reports she took 4 pills of her metronidazole and lost the bottle. RN offered a fast track appt to get more pills. Patient had to rush off the phone because she is at work and asked if we can call back after 12:30pm.  Harvie Heck, RN

## 2020-12-28 NOTE — Telephone Encounter (Signed)
Phone call to pt. Left message on voicemail that RN with [facility] is trying to get back in touch with her. Can call [phone] to schedule a fasttrack appt that was mentioned earlier, 8:00 am is first appt of the day, or can call same number for nurse.

## 2020-12-28 NOTE — Telephone Encounter (Signed)
Patient would like a prescription sent [facility] [address] Patient lost her antibiotics that was given to her last time she was in. Please call her back.

## 2020-12-29 NOTE — Telephone Encounter (Signed)
Call to patient. Patient reports she has already spoken to someone that helped her.   Harvie Heck, RN

## 2021-01-05 DIAGNOSIS — Z419 Encounter for procedure for purposes other than remedying health state, unspecified: Secondary | ICD-10-CM | POA: Diagnosis not present

## 2021-02-02 DIAGNOSIS — Z419 Encounter for procedure for purposes other than remedying health state, unspecified: Secondary | ICD-10-CM | POA: Diagnosis not present

## 2021-02-11 ENCOUNTER — Ambulatory Visit: Payer: Self-pay | Admitting: Family Medicine

## 2021-02-11 ENCOUNTER — Other Ambulatory Visit: Payer: Self-pay

## 2021-02-11 ENCOUNTER — Encounter: Payer: Self-pay | Admitting: Family Medicine

## 2021-02-11 DIAGNOSIS — N6001 Solitary cyst of right breast: Secondary | ICD-10-CM | POA: Diagnosis not present

## 2021-02-11 DIAGNOSIS — N760 Acute vaginitis: Secondary | ICD-10-CM

## 2021-02-11 DIAGNOSIS — N898 Other specified noninflammatory disorders of vagina: Secondary | ICD-10-CM

## 2021-02-11 DIAGNOSIS — B9689 Other specified bacterial agents as the cause of diseases classified elsewhere: Secondary | ICD-10-CM

## 2021-02-11 DIAGNOSIS — Z113 Encounter for screening for infections with a predominantly sexual mode of transmission: Secondary | ICD-10-CM

## 2021-02-11 LAB — WET PREP FOR TRICH, YEAST, CLUE
Trichomonas Exam: NEGATIVE
Yeast Exam: NEGATIVE

## 2021-02-11 MED ORDER — METRONIDAZOLE 0.75 % VA GEL: 1.0000 | Freq: Every day | VAGINAL | 5 refills | Status: DC

## 2021-02-11 NOTE — Progress Notes (Signed)
  [facility] Department STI clinic/screening visit  Subjective:  Cynthia Woods is a 50 y.o. female being seen today for an STI screening visit. The patient reports they do have symptoms.  Patient reports that they do not desire a pregnancy in the next year.   They reported they are not interested in discussing contraception today.  Patient's last menstrual period was 01/22/2021 (exact date).   Patient has the following medical conditions:   Patient Active Problem List   Diagnosis Date Noted  . Closed displaced fracture of distal phalanx of right thumb 10/12/2020  . Elevated BP without diagnosis of hypertension 10/12/2020  . Tension headache, chronic 07/03/2018  . Migraine without aura and without status migrainosus, not intractable 07/03/2018  . Shoulder pain, right 05/02/2018  . Cervical radiculopathy 07/20/2017  . Breast mass, right 09/09/2014  . Benign breast cyst in female 09/09/2014    Chief Complaint  Patient presents with  . STD screening     HPI  Patient reports  BV sx, "knows it is BV". Refuses exam and history today. Declined GC.CT self swab and only wants to swab for BV/Yeast/Trich. LMP was 01/22/21, stopped birth control  Last HIV test per patient/review of record was years ago- 2012 per EPIC Patient reports last pap was 05/2020 (NIL)  See flowsheet for further details and programmatic requirements.    The following portions of the patient's history were reviewed and updated as appropriate: allergies, current medications, past medical history, past social history, past surgical history and problem list.  Objective:  There were no vitals filed for this visit.  Physical Exam- declined exam. Needs to get to work in [place].    Assessment and Plan:  Cynthia Woods is a 50 y.o. female presenting to the [facility] Department for STI screening  1. Screening for STD (sexually transmitted disease) Declined questioning and  exam Self collected wet prep  2. Vaginal discharge - WET PREP FOR TRICH, YEAST, CLUE - metroNIDAZOLE (METROGEL) 0.75 % vaginal gel; Place 1 Applicatorful vaginally at bedtime. Apply one applicatorful to vagina at bedtime for 10 days, then twice a week for 6 months.  Dispense: 70 g; Refill: 5  3. Bacterial vaginitis +clue, +amine - Treating for recurrent BV - Given information about alternative preventative treatments -- in AVS - metroNIDAZOLE (METROGEL) 0.75 % vaginal gel; Place 1 Applicatorful vaginally at bedtime. Apply one applicatorful to vagina at bedtime for 10 days, then twice a week for 6 months.  Dispense: 70 g; Refill: 5    Return if symptoms worsen or fail to improve.  No future appointments.  Federico Flake, MD

## 2021-02-11 NOTE — Patient Instructions (Addendum)
Natural Remedies for Bacterial Vaginosis  Option #1 1 Tbsp Fractitionated Coconut Oil 10 drops of Melaleuca (Tea Tree) Oil  Mix ingredients together well.  Soak 3-4 tampons (in applicators) in that mixture until all or mostly all mixture is soaked up into the tampons.  Insert 1 saturated tampon vaginally and wear overnight for 3-4 nights.    Option #2 (sometimes to be used in conjunction with option #1) Fill tub with enough to cover lap/lower abdomen warm water.  Mix 1/2 cup of baking soda in water.  Soak in water/baking soda mixture for at least 20 minutes.  Be sure to swish water in between legs to get as much in vagina as possible.  This soak should be done after sexual intercourse and menstrual cycles.     Option #3 (sometimes to be used in conjunction with option #1 and 2) Fill tub with enough to cover lap/lower abdomen warm water.  Mix 2-4 cup of apple cider vinegar in water.  Soak in water/vinegar mixture for at least 20 minutes.  Be sure to swish water in between legs to get as much in vagina as possible.  This soak should be done after sexual intercourse and menstrual cycles.    GO WHITE: Soap: UNSCENTED Dove (white box light green writing) Laundry detergent (underwear)- Dreft or [employer] unscented WHITE 100% cotton panties (NOT just cotton crouch) Sanitary napkin/panty liners: UNSCENTED.  If it doesn't SAY unscented it can have a scent/perfume    NO PERFUMES OR LOTIONS OR POTIONS in the vulvar area (may use regular [place]) Condoms: hypoallergenic only. Non dyed (no color) Toilet papers: white only Wash clothes: use a separate wash cloth. WHITE.  Wash in [place].   You can purchase Tea Tree Oil locally at:  [employer] [address] [place] [address]  [employer] [address] [place] [address]  Advise that these alternatives will not replace the need to be evaluated if symptoms persist. You will need to seek care at an OB/GYN  provider.   Bacterial Vaginosis  Bacterial vaginosis is an infection of the vagina. It happens when too many normal germs (healthy bacteria) grow in the vagina. This infection can make it easier to get other infections from sex (STIs). It is very important for pregnant women to get treated. This infection can cause babies to be born early or at a low birth weight. What are the causes? This infection is caused by an increase in certain germs that grow in the vagina. You cannot get this infection from toilet seats, bedsheets, swimming pools, or things that touch your vagina. What increases the risk?  Having sex with a new person or more than one person.  Having sex without protection.  Douching.  Having an intrauterine device (IUD).  Smoking.  Using drugs or drinking alcohol. These can lead you to do things that are risky.  Taking certain antibiotic medicines.  Being pregnant. What are the signs or symptoms? Some women have no symptoms. Symptoms may include:  A discharge from your vagina. It may be gray or white. It can be watery or foamy.  A fishy smell. This can happen after sex or during your menstrual period.  Itching in and around your vagina.  A feeling of burning or pain when you pee (urinate). How is this treated? This infection is treated with antibiotic medicines. These may be given to you as:  A pill.  A cream for your vagina.  A medicine that  you put into your vagina (suppository). If the infection comes back after treatment, you may need more antibiotics. Follow these instructions at home: Medicines  Take over-the-counter and prescription medicines as told by your doctor.  Take or use your antibiotic medicine as told by your doctor. Do not stop taking or using it, even if you start to feel better. General instructions  If the person you have sex with is a woman, tell her that you have this infection. She will need to follow up with her doctor. If you  have a female partner, he does not need to be treated.  Do not have sex until you finish treatment.  Drink enough fluid to keep your pee pale yellow.  Keep your vagina and butt clean. ? Wash the area with warm water each day. ? Wipe from front to back after you use the toilet.  If you are breastfeeding a baby, ask your doctor if you should keep doing so during treatment.  Keep all follow-up visits. How is this prevented? Self-care  Do not douche.  Use only warm water to wash around your vagina.  Wear underwear that is cotton or lined with cotton.  Do not wear tight pants and pantyhose, especially in the summer. Safe sex  Use protection when you have sex. This includes: ? Use condoms. ? Use dental dams. This is a thin layer that protects the mouth during oral sex.  Limit how many people you have sex with. To prevent this infection, it is best to have sex with just one person.  Get tested for STIs. The person you have sex with should also get tested. Drugs and alcohol  Do not smoke or use any products that contain nicotine or tobacco. If you need help quitting, ask your doctor.  Do not use drugs.  Do not drink alcohol if: ? Your doctor tells you not to drink. ? You are pregnant, may be pregnant, or are planning to become pregnant.  If you drink alcohol: ? Limit how much you have to 0-1 drink a day. ? Know how much alcohol is in your drink. In the [place] one drink equals one 12 oz bottle of beer (355 mL), one 5 oz glass of wine (148 mL), or one 1 oz glass of hard liquor (44 mL). Where to find more information  Centers for Disease Control and Prevention: FootballExhibition.com.br  American Sexual Health Association: www.ashastd.org  Office on [employer] Health: http://hoffman.com/ Contact a doctor if:  Your symptoms do not get better, even after you are treated.  You have more discharge or pain when you pee.  You have a fever or chills.  You have pain in your belly (abdomen) or  in the area between your hips.  You have pain with sex.  You bleed from your vagina between menstrual periods. Summary  This infection can happen when too many germs (bacteria) grow in the vagina.  This infection can make it easier to get infections from sex (STIs). Treating this can lower that chance.  Get treated if you are pregnant. This infection can cause babies to be born early.  Do not stop taking or using your antibiotic medicine, even if you start to feel better. This information is not intended to replace advice given to you by your health care provider. Make sure you discuss any questions you have with your health care provider. Document Revised: 05/21/2020 Document Reviewed: 05/21/2020 Elsevier Patient Education  2021 [employer].

## 2021-02-11 NOTE — Progress Notes (Signed)
Patient states she knows she has BV and would like to self collect. Declines HIV/RPR Richmond Campbell, RN   Principal Financial mount reviewed; patient tx'd per [facility] order. Richmond Campbell, RN

## 2021-03-05 DIAGNOSIS — Z419 Encounter for procedure for purposes other than remedying health state, unspecified: Secondary | ICD-10-CM | POA: Diagnosis not present

## 2021-04-04 DIAGNOSIS — Z419 Encounter for procedure for purposes other than remedying health state, unspecified: Secondary | ICD-10-CM | POA: Diagnosis not present

## 2021-05-05 DIAGNOSIS — Z419 Encounter for procedure for purposes other than remedying health state, unspecified: Secondary | ICD-10-CM | POA: Diagnosis not present

## 2021-05-06 ENCOUNTER — Ambulatory Visit: Payer: Medicaid Other

## 2021-05-06 NOTE — Progress Notes (Signed)
Patient reported early at 0800 for appointment today. Stating she tried to mychart messaging the nurses to be seen earlier today due to her daughter's graduation. Spoke with provider, M. Garner, FNP and she gave her the option to wait and see if her 0840 was a no-show to take that slot. Patient then stated she did not want to risk the appointment getting [facility] late [facility], [facility] take [facility]. Patient states she will reschedule. Advised patient that when it comes to appointment chanages to please call us vs mychart messages.   Arvine Clayburn, RN  

## 2021-05-17 ENCOUNTER — Encounter: Payer: Self-pay | Admitting: Family Medicine

## 2021-05-17 ENCOUNTER — Other Ambulatory Visit (HOSPITAL_COMMUNITY)
Admission: RE | Admit: 2021-05-17 | Discharge: 2021-05-17 | Disposition: A | Discharge status: Home / Self Care | Payer: 59 | Source: Ambulatory Visit | Attending: Family Medicine | Admitting: Family Medicine

## 2021-05-17 ENCOUNTER — Other Ambulatory Visit: Payer: Self-pay

## 2021-05-17 ENCOUNTER — Ambulatory Visit (INDEPENDENT_AMBULATORY_CARE_PROVIDER_SITE_OTHER): Payer: 59 | Admitting: Family Medicine

## 2021-05-17 VITALS — BP 122/78 | HR 97 | Temp 98.3°F | Resp 16 | Ht 64.0 in | Wt 171.8 lb

## 2021-05-17 DIAGNOSIS — N898 Other specified noninflammatory disorders of vagina: Secondary | ICD-10-CM | POA: Diagnosis not present

## 2021-05-17 DIAGNOSIS — N760 Acute vaginitis: Secondary | ICD-10-CM | POA: Insufficient documentation

## 2021-05-17 DIAGNOSIS — Z0000 Encounter for general adult medical examination without abnormal findings: Secondary | ICD-10-CM

## 2021-05-17 DIAGNOSIS — B9689 Other specified bacterial agents as the cause of diseases classified elsewhere: Secondary | ICD-10-CM | POA: Insufficient documentation

## 2021-05-17 DIAGNOSIS — Z1231 Encounter for screening mammogram for malignant neoplasm of breast: Secondary | ICD-10-CM

## 2021-05-17 DIAGNOSIS — E7800 Pure hypercholesterolemia, unspecified: Secondary | ICD-10-CM

## 2021-05-17 DIAGNOSIS — Z5181 Encounter for therapeutic drug level monitoring: Secondary | ICD-10-CM | POA: Diagnosis not present

## 2021-05-17 MED ORDER — METRONIDAZOLE 0.75 % VA GEL: 1.0000 | Freq: Every day | VAGINAL | 5 refills | Status: DC

## 2021-05-17 NOTE — Progress Notes (Signed)
Patient: Cynthia Woods, Female    DOB: 1971-03-09, 50 y.o.   MRN: [id number] Steele Sizer, MD Visit Date: 05/17/2021  Today's Provider: Delsa Grana, PA-C   Chief Complaint  Patient presents with   Annual Exam   Vaginitis   Subjective:   Annual physical exam:  Cynthia Woods is a 50 y.o. female who presents today for complete physical exam:  Exercise/Activity:  working out 5d a week about 30 min/day Diet/nutrition:  eating healthy Sleep:  no problems  Vaginitis/BV recurrent - out of meds, went to health department last time  Concerns about continued HA's and wanting repeat MRI - has previously discussed with PCP  HLD - Lab Results  Component Value Date   CHOL 195 09/01/2020   HDL 79 09/01/2020   LDLCALC 96 09/01/2020   TRIG 104 09/01/2020   CHOLHDL 2.5 09/01/2020   She is no longer taking statin, though after starting labs significantly improved LDL was 192 and total cholesterol 279  USPSTF grade A and B recommendations - reviewed and addressed today  Depression:  Phq 9 completed today by patient, was reviewed by me with patient in the room PHQ score is neg, pt feels good PHQ 2/9 Scores 05/17/2021 10/12/2020 09/30/2020 09/01/2020  PHQ - 2 Score 0 0 0 0  PHQ- 9 Score 0 0 - -   Depression screen [facility] 2/9 05/17/2021 10/12/2020 09/30/2020 09/01/2020 07/02/2020  Decreased Interest 0 0 0 0 0  Down, Depressed, Hopeless 0 0 0 0 0  PHQ - 2 Score 0 0 0 0 0  Altered sleeping 0 0 - - 0  Tired, decreased energy 0 0 - - 0  Change in appetite 0 0 - - 0  Feeling bad or failure about yourself  0 0 - - 0  Trouble concentrating 0 0 - - 0  Moving slowly or fidgety/restless 0 0 - - 0  Suicidal thoughts 0 0 - - 0  PHQ-9 Score 0 0 - - 0  Difficult doing work/chores Not difficult at all - - - -  Some recent data might be hidden    Alcohol screening: [place] Office Visit from 03/12/2020 in [facility]  AUDIT-C Score 0        Immunizations and Health Maintenance: Health Maintenance  Topic Date Due   COVID-19 Vaccine (3 - Pfizer risk series) 11/18/2020   Zoster Vaccines- Shingrix (1 of 2) 08/17/2021 (Originally 09/08/1992)   Pneumococcal Vaccine 33-34 Years old (1 - PCV) 05/17/2022 (Originally 09/09/1979)   MAMMOGRAM  12/03/2021   PAP SMEAR-Modifier  05/09/2023   TETANUS/TDAP  09/09/2026   COLONOSCOPY (Pts 45-92yr Insurance coverage will need to be confirmed)  11/26/2029   Hepatitis C Screening  Completed   HIV Screening  Completed   HPV VACCINES  Aged Out   INFLUENZA VACCINE  Discontinued     Hep C Screening: done  STD testing and prevention (HIV/chl/gon/syphilis):  see above, no additional testing desired by pt today - none needing   Intimate partner violence:  denies  Sexual History/Pain during Intercourse: Single  Menstrual History/LMP/Abnormal Bleeding: stopped her birth control a few months ago, not regular Patient's last menstrual period was 05/14/2021.  Incontinence Symptoms: none  Breast cancer:  Last Mammogram: *see HM list above BRCA gene screening:  unknown family hx  Cervical cancer screening: due in 2024 Pt no family hx of cancers - breast, ovarian, uterine, colon:     Osteoporosis:   Discussion on osteoporosis per age,  including high calcium and vitamin D supplementation, weight bearing exercises  Skin cancer:  Hx of skin CA -  NO Discussed atypical lesions   Colorectal cancer:   Colonoscopy is UTD Discussed concerning signs and sx of CRC, pt denies melena hematochezia  Lung cancer:   Low Dose CT Chest recommended if Age 3-80 years, 30 pack-year currently smoking OR have quit w/in 15years. Patient does not qualify.    Social History   Tobacco Use   Smoking status: Never   Smokeless tobacco: Never  Vaping Use   Vaping Use: Never used  Substance Use Topics   Alcohol use: Yes    Alcohol/week: 1.0 standard drink    Types: 1 Standard drinks or equivalent per week     Comment: 3x/mo   Drug use: No     Flowsheet [place] Office Visit from 03/12/2020 in [facility]  AUDIT-C Score 0       Family History  Problem Relation Age of Onset   Other Mother        brain tumor   Cancer Paternal Grandfather        colon   Other Maternal Aunt        brain tumor   Other Maternal Grandmother        brain tumor   Cancer Maternal Grandmother    Diabetes Maternal Grandfather    Hypertension Paternal Grandmother      Blood pressure/Hypertension: BP Readings from Last 3 Encounters:  05/17/21 122/78  10/12/20 (!) 130/100  09/30/20 104/66    Weight/Obesity: Wt Readings from Last 3 Encounters:  05/17/21 171 lb 12.8 oz (77.9 kg)  10/12/20 158 lb 12.8 oz (72 kg)  09/30/20 162 lb 6.4 oz (73.7 kg)   BMI Readings from Last 3 Encounters:  05/17/21 29.49 kg/m  10/12/20 27.26 kg/m  09/30/20 27.88 kg/m     Lipids:  Lab Results  Component Value Date   CHOL 195 09/01/2020   CHOL 279 (H) 07/01/2020   CHOL 260 (H) 09/28/2017   Lab Results  Component Value Date   HDL 79 09/01/2020   HDL 73 07/01/2020   HDL 79 09/28/2017   Lab Results  Component Value Date   LDLCALC 96 09/01/2020   LDLCALC 192 (H) 07/01/2020   LDLCALC 163 (H) 09/28/2017   Lab Results  Component Value Date   TRIG 104 09/01/2020   TRIG 83 07/01/2020   TRIG 90 09/28/2017   Lab Results  Component Value Date   CHOLHDL 2.5 09/01/2020   CHOLHDL 3.8 07/01/2020   CHOLHDL 3.3 09/28/2017   No results found for: LDLDIRECT Based on the results of lipid panel his/her cardiovascular risk factor ( using [place] )  in the next 10 years is: The 10-year ASCVD risk score Mikey Bussing DC Brooke Bonito., et al., 2013) is: 0.6%   Values used to calculate the score:     Age: 8 years     Sex: Female     Is Non-Hispanic African American: Yes     Diabetic: No     Tobacco smoker: No     Systolic Blood Pressure: 161 mmHg     Is BP treated: No     HDL Cholesterol: 79 mg/dL     Total  Cholesterol: 195 mg/dL Glucose:  Glucose  Date Value Ref Range Status  07/01/2020 106 (H) 65 - 99 mg/dL Final  09/28/2017 117 (H) 65 - 99 mg/dL Final    Advanced Care Planning:  A voluntary discussion about advance  care planning including the explanation and discussion of advance directives.   Discussed health care proxy and Living will, and the patient was able to identify a health care proxy as her son - Merrilyn Puma Patient does not have a living will at present time- packet given today  Social History      She        Social History   Socioeconomic History   Marital status: Single    Spouse name: Not on file   Number of children: 3   Years of education: Not on file   Highest education level: Not on file  Occupational History    Comment: part   Tobacco Use   Smoking status: Never   Smokeless tobacco: Never  Vaping Use   Vaping Use: Never used  Substance and Sexual Activity   Alcohol use: Yes    Alcohol/week: 1.0 standard drink    Types: 1 Standard drinks or equivalent per week    Comment: 3x/mo   Drug use: No   Sexual activity: Yes    Partners: Male    Birth control/protection: None  Other Topics Concern   Not on file  Social History Narrative   She used to work at [employer] but had to quit after she settled her [place]'s comp claim in August 2019    Currently working at [employer] job also has a [employer] part time job    She has 3 children at home   Social Determinants of Radio broadcast assistant Strain: Medium Risk   Difficulty of Paying Living Expenses: Somewhat hard  Food Insecurity: Landscape architect Present   Worried About Charity fundraiser in the Last Year: Sometimes true   Arboriculturist in the Last Year: Sometimes true  Transportation Needs: No Transportation Needs   Lack of Transportation (Medical): No   Lack of Transportation (Non-Medical): No  Physical Activity: Sufficiently Active   Days of Exercise per Week: 5 days   Minutes of Exercise per  Session: 30 min  Stress: No Stress Concern Present   Feeling of Stress : Only a little  Social Connections: Moderately Integrated   Frequency of Communication with Friends and Family: More than three times a week   Frequency of Social Gatherings with Friends and Family: Twice a week   Attends Religious Services: 1 to 4 times per year   Active Member of [employer] or Organizations: Yes   Attends Archivist Meetings: 1 to 4 times per year   Marital Status: Never married    Family History        Family History  Problem Relation Age of Onset   Other Mother        brain tumor   Cancer Paternal Grandfather        colon   Other Maternal Aunt        brain tumor   Other Maternal Grandmother        brain tumor   Cancer Maternal Grandmother    Diabetes Maternal Grandfather    Hypertension Paternal Grandmother     Patient Active Problem List   Diagnosis Date Noted   Closed displaced fracture of distal phalanx of right thumb 10/12/2020   Elevated BP without diagnosis of hypertension 10/12/2020   Tension headache, chronic 07/03/2018   Migraine without aura and without status migrainosus, not intractable 07/03/2018   Shoulder pain, right 05/02/2018   Cervical radiculopathy 07/20/2017   Breast mass, right 09/09/2014   Benign breast  cyst in female 09/09/2014    Past Surgical History:  Procedure Laterality Date   COLONOSCOPY WITH PROPOFOL N/A 11/27/2019   Procedure: COLONOSCOPY WITH PROPOFOL;  Surgeon: Robert Bellow, MD;  Location: [facility] ENDOSCOPY;  Service: Endoscopy;  Laterality: N/A;   FOOT SURGERY  2009   GYNECOLOGIC CRYOSURGERY  1993   WISDOM TOOTH EXTRACTION  2011     Current Outpatient Medications:    cholecalciferol (VITAMIN D) 1000 units tablet, Take 1,000 Units by mouth daily., Disp: , Rfl:    desogestrel-ethinyl estradiol (KARIVA) 0.15-0.02/0.01 MG (21/5) tablet, Take 1 tablet by mouth daily., Disp: 84 tablet, Rfl: 3   diclofenac (VOLTAREN) 75 MG EC tablet,  Take 1 tablet (75 mg total) by mouth 2 (two) times daily., Disp: 30 tablet, Rfl: 0   metroNIDAZOLE (METROGEL) 0.75 % vaginal gel, Place 1 Applicatorful vaginally at bedtime. Apply one applicatorful to vagina at bedtime for 10 days, then twice a week for 6 months., Disp: 70 g, Rfl: 5   Multiple Vitamins-Minerals (WOMENS MULTIVITAMIN PO), Take by mouth., Disp: , Rfl:    OVER THE COUNTER MEDICATION, Take 2 each by mouth daily. [place] Apple Cider Vinegar Gummies, Disp: , Rfl:    rosuvastatin (CRESTOR) 10 MG tablet, Take 1 tablet (10 mg total) by mouth daily., Disp: 90 tablet, Rfl: 1  Allergies  Allergen Reactions   Shellfish Allergy Anaphylaxis   Sulfa Antibiotics Anaphylaxis   Naproxen Diarrhea   Latex Rash    Patient Care Team: Steele Sizer, MD as PCP - General (Family Medicine) Kate Sable, MD as PCP - Cardiology (Cardiology) Steele Sizer, MD (Family Medicine) Bary Castilla Forest Gleason, MD (General Surgery)  Review of Systems  Constitutional: Negative.   HENT: Negative.    Eyes: Negative.   Respiratory: Negative.    Cardiovascular: Negative.   Gastrointestinal: Negative.   Endocrine: Negative.   Genitourinary: Negative.   Musculoskeletal: Negative.   Skin: Negative.   Allergic/Immunologic: Negative.   Neurological: Negative.   Hematological: Negative.   Psychiatric/Behavioral: Negative.    All other systems reviewed and are negative.   I personally reviewed active problem list, medication list, allergies, family history, social history, health maintenance, notes from last encounter, lab results, imaging with the patient/caregiver today.        Objective:   Vitals:  Vitals:   05/17/21 1531  BP: 122/78  Pulse: 97  Resp: 16  Temp: 98.3 F (36.8 C)  SpO2: 99%  Weight: 171 lb 12.8 oz (77.9 kg)  Height: _0  (1.626 m)    Body mass index is 29.49 kg/m.  Physical Exam Vitals and nursing note reviewed.  Constitutional:      General: She is not in acute  distress.    Appearance: Normal appearance. She is well-developed, well-groomed and overweight. She is not ill-appearing, toxic-appearing or diaphoretic.     Interventions: Face mask in place.  HENT:     Head: Normocephalic and atraumatic.     Right Ear: External ear normal.     Left Ear: External ear normal.  Eyes:     General: Lids are normal. No scleral icterus.       Right eye: No discharge.        Left eye: No discharge.     Conjunctiva/sclera: Conjunctivae normal.  Neck:     Trachea: Phonation normal. No tracheal deviation.  Cardiovascular:     Rate and Rhythm: Normal rate and regular rhythm.     Pulses: Normal pulses.  Radial pulses are 2+ on the right side and 2+ on the left side.       Posterior tibial pulses are 2+ on the right side and 2+ on the left side.     Heart sounds: Normal heart sounds. No murmur heard.   No friction rub. No gallop.  Pulmonary:     Effort: Pulmonary effort is normal. No respiratory distress.     Breath sounds: Normal breath sounds. No stridor. No wheezing, rhonchi or rales.  Chest:     Chest wall: No tenderness.  Abdominal:     General: Bowel sounds are normal. There is no distension.     Palpations: Abdomen is soft.     Tenderness: There is no right CVA tenderness or left CVA tenderness.  Musculoskeletal:     Right lower leg: No edema.     Left lower leg: No edema.  Skin:    General: Skin is warm and dry.     Coloration: Skin is not jaundiced or pale.     Findings: No rash.  Neurological:     Mental Status: She is alert.     Motor: No abnormal muscle tone.     Gait: Gait normal.  Psychiatric:        Mood and Affect: Mood normal.        Speech: Speech normal.        Behavior: Behavior normal. Behavior is cooperative.      Fall Risk: Fall Risk  05/17/2021 10/12/2020 09/30/2020 09/01/2020 07/02/2020  Falls in the past year? 0 0 0 0 0  Comment - - - - -  Number falls in past yr: 0 0 0 0 0  Injury with Fall? 0 0 0 0 0  Comment  - - - - -  Follow up - - - - -    Functional Status Survey: Is the patient deaf or have difficulty hearing?: No Does the patient have difficulty seeing, even when wearing glasses/contacts?: No Does the patient have difficulty concentrating, remembering, or making decisions?: No Does the patient have difficulty walking or climbing stairs?: No Does the patient have difficulty dressing or bathing?: No Does the patient have difficulty doing errands alone such as visiting a doctor's office or shopping?: No   Assessment & Plan:    CPE completed today  USPSTF grade A and B recommendations reviewed with patient; age-appropriate recommendations, preventive care, screening tests, etc discussed and encouraged; healthy living encouraged; see AVS for patient education given to patient  Discussed importance of 150 minutes of physical activity weekly, AHA exercise recommendations given to pt in AVS/handout  Discussed importance of healthy diet:  eating lean meats and proteins, avoiding trans fats and saturated fats, avoid simple sugars and excessive carbs in diet, eat 6 servings of fruit/vegetables daily and drink plenty of water and avoid sweet beverages.    Recommended pt to do annual eye exam and routine dental exams/cleanings  Depression, alcohol, fall screening completed as documented above and per flowsheets  Reviewed Health Maintenance: Health Maintenance  Topic Date Due   COVID-19 Vaccine (3 - Pfizer risk series) 11/18/2020   Zoster Vaccines- Shingrix (1 of 2) 08/17/2021 (Originally 09/08/1992)   Pneumococcal Vaccine 33-11 Years old (1 - PCV) 05/17/2022 (Originally 09/09/1979)   MAMMOGRAM  12/03/2021   PAP SMEAR-Modifier  05/09/2023   TETANUS/TDAP  09/09/2026   COLONOSCOPY (Pts 45-63yr Insurance coverage will need to be confirmed)  11/26/2029   Hepatitis C Screening  Completed   HIV Screening  Completed   HPV VACCINES  Aged Out   INFLUENZA VACCINE  Discontinued     Immunizations: Immunization History  Administered Date(s) Administered   Hep A / Hep B 03/04/2009, 05/24/2010   Hepatitis B, adult 07/03/2012   PFIZER Comirnaty(Gray Top)Covid-19 Tri-Sucrose Vaccine 09/30/2020, 10/21/2020   Td 06/20/2005, 09/09/2016   Tdap 09/09/2016      ICD-10-CM   1. Adult general medical exam  Z00.00 CBC with Differential/Platelet    COMPLETE METABOLIC PANEL WITH GFR    Lipid panel    2. Pure hypercholesterolemia  E26.83 COMPLETE METABOLIC PANEL WITH GFR    Lipid panel   pt stopped meds, though they did significantly improve lipids last year when taking, discussed likely need to restart    3. Encounter for medication monitoring  Z51.81     4. Vaginal discharge  N89.8 Cervicovaginal ancillary only    metroNIDAZOLE (METROGEL) 0.75 % vaginal gel    5. Bacterial vaginitis  N76.0 Cervicovaginal ancillary only   B96.89 metroNIDAZOLE (METROGEL) 0.75 % vaginal gel   recurrent - vaginal swab done - metrogel refilled - reviewed dosing for recurrent BV    6. Encounter for screening mammogram for malignant neoplasm of breast  Z12.31 MM 3D SCREEN BREAST BILATERAL   mammo due at the end of the year - sees surgeon for recurrent breast cysts           Delsa Grana, PA-C 05/17/21 4:09 PM  [facility]

## 2021-05-17 NOTE — Patient Instructions (Signed)
Health Maintenance  Topic Date Due   COVID-19 Vaccine (1) Never done   Pneumococcal Vaccination (1 - PCV) Never done   Zoster (Shingles) Vaccine (1 of 2) Never done   Pap Smear  05/09/2023   Tetanus Vaccine  09/09/2026   Colon Cancer Screening  11/26/2029   Hepatitis C Screening: USPSTF Recommendation to screen - Ages 18-48 yo.  Completed   HIV Screening  Completed   HPV Vaccine  Aged Out   Flu Shot  Discontinued   Recommendations on cholesterol and starting statins.    There is a benefit from LDL-C (bad cholesterol) lowering with statin therapy at virtually all levels of cardiovascular risk.  If statin therapy had no side effects and caused no financial burden, it might be reasonable to recommend it to virtually all at-risk individuals, similar to a healthy diet and exercise  It is this good of a medication!!  It reduces risk in almost everyone.   There are possible side effects with ALL medications and with statins there is a small subset of the population who may not metabolize it well, which causing muscle aches as a side effect (~5%).  We monitor for this, can test for this, and usually are careful to work with you to get a medication that gives you the benefits with minimal side effects.  Some people are sensitive to medications in general and we try to use the highest dose tolerated to give the most benefit.   American Heart Association/American College of Cardiology cholesterol and statin guidelines are as follows: In adults 45 to 48 years of age without diabetes mellitus and with LDL-C levels ?70, at a 10-year atherosclerotic cardiovascular disease risk of ?7.5 percent, start a moderate-intensity statin if a discussion of treatment options favors statin therapy  If LDL is >160, statins are indicated.  Patients with other significant risk factors would also benefit from statin.  Some of these factors include a family history of premature cardiovascular disease, chronic kidney  disease, or chronic inflammatory disorder (such as chronic human immunodeficiency viral infection).   Can get more information at the following website:  [employer]Program.fi

## 2021-05-18 ENCOUNTER — Other Ambulatory Visit: Payer: Self-pay | Admitting: Family Medicine

## 2021-05-18 DIAGNOSIS — E7800 Pure hypercholesterolemia, unspecified: Secondary | ICD-10-CM

## 2021-05-18 LAB — COMPLETE METABOLIC PANEL WITH GFR
AG Ratio: 1.4 (calc) (ref 1.0–2.5)
ALT: 14 U/L (ref 6–29)
AST: 17 U/L (ref 10–35)
Albumin: 3.9 g/dL (ref 3.6–5.1)
Alkaline phosphatase (APISO): 66 U/L (ref 31–125)
BUN: 13 mg/dL (ref 7–25)
CO2: 27 mmol/L (ref 20–32)
Calcium: 9 mg/dL (ref 8.6–10.2)
Chloride: 107 mmol/L (ref 98–110)
Creat: 0.8 mg/dL (ref 0.50–1.10)
GFR, Est African American: 102 mL/min/{1.73_m2} (ref >=60)
GFR, Est Non African American: 88 mL/min/{1.73_m2} (ref >=60)
Globulin: 2.7 g/dL (calc) (ref 1.9–3.7)
Glucose, Bld: 87 mg/dL (ref 65–99)
Potassium: 4.6 mmol/L (ref 3.5–5.3)
Sodium: 140 mmol/L (ref 135–146)
Total Bilirubin: 0.3 mg/dL (ref 0.2–1.2)
Total Protein: 6.6 g/dL (ref 6.1–8.1)

## 2021-05-18 LAB — LIPID PANEL
Cholesterol: 246 mg/dL — ABNORMAL HIGH (ref <200)
HDL: 61 mg/dL (ref >=50)
LDL Cholesterol (Calc): 154 mg/dL (calc) — ABNORMAL HIGH
Non-HDL Cholesterol (Calc): 185 mg/dL (calc) — ABNORMAL HIGH (ref <130)
Total CHOL/HDL Ratio: 4 (calc) (ref <5.0)
Triglycerides: 168 mg/dL — ABNORMAL HIGH (ref <150)

## 2021-05-18 LAB — CBC WITH DIFFERENTIAL/PLATELET
Absolute Monocytes: 581 cells/uL (ref 200–950)
Basophils Absolute: 29 cells/uL (ref 0–200)
Basophils Relative: 0.5 %
Eosinophils Absolute: 143 cells/uL (ref 15–500)
Eosinophils Relative: 2.5 %
HCT: 37.3 % (ref 35.0–45.0)
Hemoglobin: 12.5 g/dL (ref 11.7–15.5)
Lymphs Abs: 2451 cells/uL (ref 850–3900)
MCH: 31.1 pg (ref 27.0–33.0)
MCHC: 33.5 g/dL (ref 32.0–36.0)
MCV: 92.8 fL (ref 80.0–100.0)
MPV: 10.8 fL (ref 7.5–12.5)
Monocytes Relative: 10.2 %
Neutro Abs: 2497 cells/uL (ref 1500–7800)
Neutrophils Relative %: 43.8 %
Platelets: 224 10*3/uL (ref 140–400)
RBC: 4.02 10*6/uL (ref 3.80–5.10)
RDW: 12.1 % (ref 11.0–15.0)
Total Lymphocyte: 43 %
WBC: 5.7 10*3/uL (ref 3.8–10.8)

## 2021-05-18 MED ORDER — ROSUVASTATIN CALCIUM 10 MG PO TABS: 10.0000 mg | ORAL_TABLET | Freq: Every day | ORAL | 1 refills | Status: DC

## 2021-05-19 ENCOUNTER — Encounter: Payer: Self-pay | Admitting: Family Medicine

## 2021-05-19 DIAGNOSIS — E7800 Pure hypercholesterolemia, unspecified: Secondary | ICD-10-CM

## 2021-05-19 LAB — CERVICOVAGINAL ANCILLARY ONLY
Bacterial Vaginitis (gardnerella): NEGATIVE
Candida Glabrata: NEGATIVE
Candida Vaginitis: NEGATIVE
Comment: NEGATIVE
Comment: NEGATIVE
Comment: NEGATIVE

## 2021-05-19 NOTE — Telephone Encounter (Signed)
Copied from CRM [id number]. Topic: Referral - Status >> May 19, 2021  1:05 PM Pawlus, Maxine Glenn A wrote: Reason for CRM: Pt was following up on a referral to [facility] / nutritionist, please advise pt when this has been sent in.

## 2021-05-21 DIAGNOSIS — H1045 Other chronic allergic conjunctivitis: Secondary | ICD-10-CM | POA: Diagnosis not present

## 2021-05-26 ENCOUNTER — Telehealth: Payer: Self-pay

## 2021-05-26 NOTE — Telephone Encounter (Signed)
Copied from CRM [id number]. Topic: General - Call Back - No Documentation >> May 25, 2021  4:26 PM Randol Kern wrote: Reason for CRM: Pt called to report that she needs her codes filed for her insurance so she can get credit for her CPE that she had last week. Please advise

## 2021-05-26 NOTE — Telephone Encounter (Signed)
Spoke with patient and informed her that her CPE that was done on 05/17/2021 was filed to her insurance company on 05/18/2021.  Pt verbalized understanding.

## 2021-06-01 ENCOUNTER — Telehealth: Payer: Self-pay

## 2021-06-01 NOTE — Telephone Encounter (Signed)
From encounter on 05/06/21  Patient reported early at 0800 for appointment today. Stating she tried to [place] the nurses to be seen earlier today due to her daughter's graduation. Spoke with provider, Elveria Rising, FNP and she gave her the option to wait and see if her 0840 was a no-show to take that slot. Patient then stated she did not want to risk the appointment getting her late to [place], where the graduation will take place. Patient states she will reschedule. Advised patient that when it comes to appointment chanages to please call us vs mychart messages.   Floy Sabina, RN

## 2021-06-01 NOTE — Progress Notes (Deleted)
Patient reported early at 0800 for appointment today. Stating she tried to [place] the nurses to be seen earlier today due to her daughter's graduation. Spoke with provider, Elveria Rising, FNP and she gave her the option to wait and see if her 0840 was a no-show to take that slot. Patient then stated she did not want to risk the appointment getting her late to [place], where the graduation will take place. Patient states she will reschedule. Advised patient that when it comes to appointment chanages to please call us vs mychart messages.   Floy Sabina, RN

## 2021-06-04 DIAGNOSIS — Z419 Encounter for procedure for purposes other than remedying health state, unspecified: Secondary | ICD-10-CM | POA: Diagnosis not present

## 2021-06-11 ENCOUNTER — Encounter: Payer: Self-pay | Admitting: Family Medicine

## 2021-06-23 ENCOUNTER — Encounter: Payer: Self-pay | Admitting: Family Medicine

## 2021-06-27 ENCOUNTER — Encounter: Payer: Self-pay | Admitting: Family Medicine

## 2021-07-05 DIAGNOSIS — Z419 Encounter for procedure for purposes other than remedying health state, unspecified: Secondary | ICD-10-CM | POA: Diagnosis not present

## 2021-07-31 ENCOUNTER — Encounter: Payer: Self-pay | Admitting: Family Medicine

## 2021-08-02 ENCOUNTER — Other Ambulatory Visit: Payer: Self-pay

## 2021-08-02 ENCOUNTER — Telehealth (INDEPENDENT_AMBULATORY_CARE_PROVIDER_SITE_OTHER): Payer: 59 | Admitting: Physician Assistant

## 2021-08-02 ENCOUNTER — Other Ambulatory Visit: Payer: 59

## 2021-08-02 DIAGNOSIS — N3000 Acute cystitis without hematuria: Secondary | ICD-10-CM

## 2021-08-02 LAB — MICROSCOPIC EXAMINATION

## 2021-08-02 LAB — URINALYSIS, COMPLETE
Bilirubin, UA: NEGATIVE
Glucose, UA: NEGATIVE
Ketones, UA: NEGATIVE
Leukocytes,UA: NEGATIVE
Nitrite, UA: NEGATIVE
Specific Gravity, UA: >1.03 — ABNORMAL HIGH (ref 1.005–1.030)
Urobilinogen, Ur: 0.2 mg/dL (ref 0.2–1.0)
pH, UA: 5.5 (ref 5.0–7.5)

## 2021-08-02 MED ORDER — NITROFURANTOIN MONOHYD MACRO 100 MG PO CAPS: 100.0000 mg | ORAL_CAPSULE | Freq: Two times a day (BID) | ORAL | 0 refills | Status: DC

## 2021-08-02 NOTE — Telephone Encounter (Signed)
Patient reports a 10-day history of urinary frequency and dysuria. Urine microscopy notable for 6-10 WBCs/hpf and many bacteria. Will send for culture and start empiric Macrobid 100mg  BID x5 days.

## 2021-08-03 ENCOUNTER — Telehealth: Payer: 59 | Admitting: Family Medicine

## 2021-08-03 DIAGNOSIS — Z113 Encounter for screening for infections with a predominantly sexual mode of transmission: Secondary | ICD-10-CM | POA: Diagnosis not present

## 2021-08-03 DIAGNOSIS — Z309 Encounter for contraceptive management, unspecified: Secondary | ICD-10-CM | POA: Diagnosis not present

## 2021-08-03 DIAGNOSIS — Z6828 Body mass index (BMI) 28.0-28.9, adult: Secondary | ICD-10-CM | POA: Diagnosis not present

## 2021-08-03 DIAGNOSIS — Z01419 Encounter for gynecological examination (general) (routine) without abnormal findings: Secondary | ICD-10-CM | POA: Diagnosis not present

## 2021-08-03 DIAGNOSIS — N760 Acute vaginitis: Secondary | ICD-10-CM | POA: Diagnosis not present

## 2021-08-03 LAB — HM PAP SMEAR: HM Pap smear: NEGATIVE

## 2021-08-05 ENCOUNTER — Other Ambulatory Visit: Payer: Self-pay | Admitting: Physician Assistant

## 2021-08-05 DIAGNOSIS — Z419 Encounter for procedure for purposes other than remedying health state, unspecified: Secondary | ICD-10-CM | POA: Diagnosis not present

## 2021-08-05 DIAGNOSIS — N3000 Acute cystitis without hematuria: Secondary | ICD-10-CM

## 2021-08-05 LAB — CULTURE, URINE COMPREHENSIVE

## 2021-08-05 MED ORDER — CIPROFLOXACIN HCL 250 MG PO TABS: 250.0000 mg | ORAL_TABLET | Freq: Two times a day (BID) | ORAL | 0 refills | Status: AC

## 2021-08-05 NOTE — Progress Notes (Signed)
Urine culture finalized with intermediate susceptibility to Macrobid.  Counseled patient to stop this and we will switch her to Cipro 250 mg twice daily x5 days as an alternative.  She expressed understanding.

## 2021-08-10 ENCOUNTER — Ambulatory Visit: Payer: Self-pay

## 2021-08-10 DIAGNOSIS — M5412 Radiculopathy, cervical region: Secondary | ICD-10-CM | POA: Diagnosis not present

## 2021-08-10 NOTE — Telephone Encounter (Signed)
Pt c/o numbness and tingling from right shoulder to finger tips. Pt stated it feels like she slept on it wrong. Sx began last week. Pt stated her right hand is cooler than the left.  Pt had a h/o a fall at work in 2018. Pt c/o mild dull pain to her right side of neck. Pt called form work. Called [facility] and spoke with Carollee Herter RN who stated pt should go to ED or UC. Advised pt to call back and cancel appt if not coming to appt. Pt stated she will go on her lunch break. Care advice given and pt verbalized understanding.           Reason for Disposition  [1] Weakness of the face, arm / hand, or leg / foot on one side of the body AND [2] gradual onset (e.g., days to weeks) AND [3] present now  Answer Assessment - Initial Assessment Questions 1. ONSET: "When did the pain start?"     Last week Monday and Tuesday 10/10 took muscle pain 2. LOCATION: "Where is the pain located?"     Right neck, right shoulder blade 3. PAIN: "How bad is the pain?" (Scale 1-10; or mild, moderate, severe)   - MILD (1-3): doesn't interfere with normal activities   - MODERATE (4-7): interferes with normal activities (e.g., work or school) or awakens from sleep   - SEVERE (8-10): excruciating pain, unable to do any normal activities, unable to hold a cup of water     Mild dull pain 4. WORK OR EXERCISE: "Has there been any recent work or exercise that involved this part of the body?"     Fell slipped on wet floor 5. CAUSE: "What do you think is causing the arm pain?"     Something wrong with like she slept wrong 6. OTHER SYMPTOMS: "Do you have any other symptoms?" (e.g., neck pain, swelling, rash, fever, numbness, weakness)     Numbness and tingling from shoulder to finger tips,feel cold to touch, neck pain, cold hands 7. PREGNANCY: "Is there any chance you are pregnant?" "When was your last menstrual period?"     No-LMP 8/21  Answer Assessment - Initial Assessment Questions 1. SYMPTOM: "What is the main  symptom you are concerned about?" (e.g., weakness, numbness)     Weakness, numbness 2. ONSET: "When did this start?" (minutes, hours, days; while sleeping)     Last week 3. LAST NORMAL: "When was the last time you (the patient) were normal (no symptoms)?"     Last Monday 4. PATTERN "Does this come and go, or has it been constant since it started?"  "Is it present now?"     constant 5. CARDIAC SYMPTOMS: "Have you had any of the following symptoms: chest pain, difficulty breathing, palpitations?"     no 6. NEUROLOGIC SYMPTOMS: "Have you had any of the following symptoms: headache, dizziness, vision loss, double vision, changes in speech, unsteady on your feet?"     Right hand is colder than left 7. OTHER SYMPTOMS: "Do you have any other symptoms?"     Pain to neck and right shoulder blades 8. PREGNANCY: "Is there any chance you are pregnant?" "When was your last menstrual period?"     No- August  Protocols used: Arm Pain-A-AH, Neurologic Deficit-A-AH

## 2021-08-11 ENCOUNTER — Ambulatory Visit: Payer: 59 | Admitting: Family Medicine

## 2021-08-11 ENCOUNTER — Telehealth: Payer: Self-pay

## 2021-08-11 NOTE — Progress Notes (Deleted)
Name: Cynthia Woods   MRN: [id number]    DOB: 04/24/1971   Date:08/11/2021       Progress Note  Subjective  Chief Complaint  Neck Pain  HPI  *** Patient Active Problem List   Diagnosis Date Noted   Pure hypercholesterolemia 05/17/2021   Closed displaced fracture of distal phalanx of right thumb 10/12/2020   Elevated BP without diagnosis of hypertension 10/12/2020   Tension headache, chronic 07/03/2018   Migraine without aura and without status migrainosus, not intractable 07/03/2018   Shoulder pain, right 05/02/2018   Cervical radiculopathy 07/20/2017   Breast mass, right 09/09/2014   Benign breast cyst in female 09/09/2014    Past Surgical History:  Procedure Laterality Date   COLONOSCOPY WITH PROPOFOL N/A 11/27/2019   Procedure: COLONOSCOPY WITH PROPOFOL;  Surgeon: Earline MayotteByrnett, Jeffrey W, MD;  Location: [facility] ENDOSCOPY;  Service: Endoscopy;  Laterality: N/A;   FOOT SURGERY  2009   GYNECOLOGIC CRYOSURGERY  1993   WISDOM TOOTH EXTRACTION  2011    Family History  Problem Relation Age of Onset   Other Mother        brain tumor   Cancer Paternal Grandfather        colon   Other Maternal Aunt        brain tumor   Other Maternal Grandmother        brain tumor   Cancer Maternal Grandmother    Diabetes Maternal Grandfather    Hypertension Paternal Grandmother     Social History   Tobacco Use   Smoking status: Never   Smokeless tobacco: Never  Substance Use Topics   Alcohol use: Yes    Alcohol/week: 1.0 standard drink    Types: 1 Standard drinks or equivalent per week    Comment: 3x/mo     Current Outpatient Medications:    cholecalciferol (VITAMIN D) 1000 units tablet, Take 1,000 Units by mouth daily., Disp: , Rfl:    desogestrel-ethinyl estradiol (KARIVA) 0.15-0.02/0.01 MG (21/5) tablet, Take 1 tablet by mouth daily., Disp: 84 tablet, Rfl: 3   diclofenac (VOLTAREN) 75 MG EC tablet, Take 1 tablet (75 mg total) by mouth 2 (two) times daily., Disp: 30 tablet,  Rfl: 0   metroNIDAZOLE (METROGEL) 0.75 % vaginal gel, Place 1 Applicatorful vaginally at bedtime. Apply one applicatorful to vagina at bedtime for 10 days, then twice a week for 6 months., Disp: 70 g, Rfl: 5   Multiple Vitamins-Minerals (WOMENS MULTIVITAMIN PO), Take by mouth., Disp: , Rfl:    OVER THE COUNTER MEDICATION, Take 2 each by mouth daily. Goli Apple Cider Vinegar Gummies, Disp: , Rfl:    rosuvastatin (CRESTOR) 10 MG tablet, Take 1 tablet (10 mg total) by mouth daily., Disp: 90 tablet, Rfl: 1  Allergies  Allergen Reactions   Shellfish Allergy Anaphylaxis   Sulfa Antibiotics Anaphylaxis   Naproxen Diarrhea   Latex Rash    I personally reviewed {Reviewed:14835} with the patient/caregiver today.   ROS  ***  Objective  There were no vitals filed for this visit.  There is no height or weight on file to calculate BMI.  Physical Exam ***  Recent Results (from the past 2160 hour(s))  Cervicovaginal ancillary only     Status: None   Collection Time: 05/17/21  4:13 PM  Result Value Ref Range   Bacterial Vaginitis (gardnerella) Negative    Candida Vaginitis Negative    Candida Glabrata Negative    Comment Normal Reference Range Candida Species - Negative    Comment  Normal Reference Range Candida Galbrata - Negative    Comment      Normal Reference Range Bacterial Vaginosis - Negative  CBC with Differential/Platelet     Status: None   Collection Time: 05/17/21  4:21 PM  Result Value Ref Range   WBC 5.7 3.8 - 10.8 Thousand/uL   RBC 4.02 3.80 - 5.10 Million/uL   Hemoglobin 12.5 11.7 - 15.5 g/dL   HCT 46.6 59.9 - 35.7 %   MCV 92.8 80.0 - 100.0 fL   MCH 31.1 27.0 - 33.0 pg   MCHC 33.5 32.0 - 36.0 g/dL   RDW 01.7 79.3 - 90.3 %   Platelets 224 140 - 400 Thousand/uL   MPV 10.8 7.5 - 12.5 fL   Neutro Abs 2,497 1,500 - 7,800 cells/uL   Lymphs Abs 2,451 850 - 3,900 cells/uL   Absolute Monocytes 581 200 - 950 cells/uL   Eosinophils Absolute 143 15 - 500 cells/uL    Basophils Absolute 29 0 - 200 cells/uL   Neutrophils Relative % 43.8 %   Total Lymphocyte 43.0 %   Monocytes Relative 10.2 %   Eosinophils Relative 2.5 %   Basophils Relative 0.5 %  COMPLETE METABOLIC PANEL WITH GFR     Status: None   Collection Time: 05/17/21  4:21 PM  Result Value Ref Range   Glucose, Bld 87 65 - 99 mg/dL    Comment: .            Fasting reference interval .    BUN 13 7 - 25 mg/dL   Creat 0.09 2.33 - 0.07 mg/dL   GFR, Est Non African American 88 > OR = 60 mL/min/1.35m2   GFR, Est African American 102 > OR = 60 mL/min/1.28m2   BUN/Creatinine Ratio NOT APPLICABLE 6 - 22 (calc)   Sodium 140 135 - 146 mmol/L   Potassium 4.6 3.5 - 5.3 mmol/L   Chloride 107 98 - 110 mmol/L   CO2 27 20 - 32 mmol/L   Calcium 9.0 8.6 - 10.2 mg/dL   Total Protein 6.6 6.1 - 8.1 g/dL   Albumin 3.9 3.6 - 5.1 g/dL   Globulin 2.7 1.9 - 3.7 g/dL (calc)   AG Ratio 1.4 1.0 - 2.5 (calc)   Total Bilirubin 0.3 0.2 - 1.2 mg/dL   Alkaline phosphatase (APISO) 66 31 - 125 U/L   AST 17 10 - 35 U/L   ALT 14 6 - 29 U/L  Lipid panel     Status: Abnormal   Collection Time: 05/17/21  4:21 PM  Result Value Ref Range   Cholesterol 246 (H) <200 mg/dL   HDL 61 > OR = 50 mg/dL   Triglycerides 622 (H) <150 mg/dL   LDL Cholesterol (Calc) 154 (H) mg/dL (calc)    Comment: Reference range: <100 . Desirable range <100 mg/dL for primary prevention;   <70 mg/dL for patients with CHD or diabetic patients  with > or = 2 CHD risk factors. Marland Kitchen LDL-C is now calculated using the Martin-Hopkins  calculation, which is a validated novel method providing  better accuracy than the Friedewald equation in the  estimation of LDL-C.  Horald Pollen et al. Lenox Ahr. 6333;545(62): 2061-2068  (http://education.[facility].com/faq/FAQ164)    Total CHOL/HDL Ratio 4.0 <5.0 (calc)   Non-HDL Cholesterol (Calc) 185 (H) <130 mg/dL (calc)    Comment: For patients with diabetes plus 1 major ASCVD risk  factor, treating to a non-HDL-C  goal of <100 mg/dL  (LDL-C of <56 mg/dL) is considered a therapeutic  option.   CULTURE, URINE COMPREHENSIVE     Status: Abnormal   Collection Time: 08/02/21  1:07 PM   Specimen: Urine   UR  Result Value Ref Range   Urine Culture, Comprehensive Final report (A)    Organism ID, Bacteria Klebsiella aerogenes (A)     Comment: Greater than 100,000 colony forming units per mL formerly Enterobacter aerogenes    Organism ID, Bacteria Not applicable    ANTIMICROBIAL SUSCEPTIBILITY Comment     Comment:       ** S = Susceptible; I = Intermediate; R = Resistant **                    P = Positive; N = Negative             MICS are expressed in micrograms per mL    Antibiotic                 RSLT#1    RSLT#2    RSLT#3    RSLT#4 Amoxicillin/Clavulanic Acid    R Cefepime                       S Ceftriaxone                    S Cefuroxime                     R Ciprofloxacin                  S Ertapenem                      S Gentamicin                     S Imipenem                       S Levofloxacin                   S Meropenem                      S Nitrofurantoin                 I Tetracycline                   S Tobramycin                     S Trimethoprim/Sulfa             S   Urinalysis, Complete     Status: Abnormal   Collection Time: 08/02/21  1:07 PM  Result Value Ref Range   Specific Gravity, UA >1.030 (H) 1.005 - 1.030   pH, UA 5.5 5.0 - 7.5   Color, UA Yellow Yellow   Appearance Ur Cloudy (A) Clear   Leukocytes,UA Negative Negative   Protein,UA Trace (A) Negative/Trace   Glucose, UA Negative Negative   Ketones, UA Negative Negative   RBC, UA Trace (A) Negative   Bilirubin, UA Negative Negative   Urobilinogen, Ur 0.2 0.2 - 1.0 mg/dL   Nitrite, UA Negative Negative   Microscopic Examination See below:   Microscopic Examination     Status: Abnormal   Collection Time: 08/02/21  1:07 PM   Urine  Result Value Ref Range   WBC, UA 6-10 (A) 0 -  5 /hpf   RBC 0-2 0 - 2  /hpf   Epithelial Cells (non renal) 0-10 0 - 10 /hpf   Mucus, UA Present (A) Not Estab.   Bacteria, UA Many (A) None seen/Few    Diabetic Foot Exam: Diabetic Foot Exam - Simple   No data filed    ***  PHQ2/9: Depression screen [facility] 2/9 05/17/2021 10/12/2020 09/30/2020 09/01/2020 07/02/2020  Decreased Interest 0 0 0 0 0  Down, Depressed, Hopeless 0 0 0 0 0  PHQ - 2 Score 0 0 0 0 0  Altered sleeping 0 0 - - 0  Tired, decreased energy 0 0 - - 0  Change in appetite 0 0 - - 0  Feeling bad or failure about yourself  0 0 - - 0  Trouble concentrating 0 0 - - 0  Moving slowly or fidgety/restless 0 0 - - 0  Suicidal thoughts 0 0 - - 0  PHQ-9 Score 0 0 - - 0  Difficult doing work/chores Not difficult at all - - - -  Some recent data might be hidden    phq 9 is {gen pos [id number]} ***  Fall Risk: Fall Risk  05/17/2021 10/12/2020 09/30/2020 09/01/2020 07/02/2020  Falls in the past year? 0 0 0 0 0  Comment - - - - -  Number falls in past yr: 0 0 0 0 0  Injury with Fall? 0 0 0 0 0  Comment - - - - -  Follow up - - - - -   ***   Functional Status Survey:   ***   Assessment & Plan  *** There are no diagnoses linked to this encounter.

## 2021-08-11 NOTE — Telephone Encounter (Signed)
Attempted return patient's call, the number [phone] is not a working number. I called her cell number [phone] and could not hear the patient/line was breaking up. Will try again.

## 2021-08-11 NOTE — Telephone Encounter (Signed)
Copied from CRM [id number]. Topic: General - Call Back - No Documentation >> Aug 11, 2021 12:59 PM Randol Kern wrote: Reason for CRM: Pt is requesting a call back, she has questions about a Rx she was prescribed and wants to speak  Best contact: [phone] until call 5:00pm

## 2021-08-11 NOTE — Telephone Encounter (Signed)
Patient called back, she wanted clarification on how to take her Prednisone dose pack given to her by a different Provider. Per prescription directions and verification from Dr. Carlynn Purl information was given. Patient stated she had taken Tramadol last night that was given to her by that same Provider and it made her nauseous and unable to sleep. She was advised to reach out to that Provider for advice and recommendations. Patient was pleasant,and verbalized understanding.

## 2021-08-12 ENCOUNTER — Encounter: Payer: Self-pay | Admitting: Dietician

## 2021-08-12 ENCOUNTER — Encounter: Payer: 59 | Attending: Family Medicine | Admitting: Dietician

## 2021-08-12 ENCOUNTER — Other Ambulatory Visit: Payer: Self-pay

## 2021-08-12 VITALS — Ht 63.0 in | Wt 167.0 lb

## 2021-08-12 DIAGNOSIS — E7800 Pure hypercholesterolemia, unspecified: Secondary | ICD-10-CM | POA: Insufficient documentation

## 2021-08-12 NOTE — Patient Instructions (Addendum)
Great job making healthy diet choices! Keep it up! Stay as active as possible to keep "healthy" HDL cholesterol up.

## 2021-08-12 NOTE — Progress Notes (Signed)
Medical Nutrition Therapy: Visit start time: 1630  end time: 1730  Assessment:  Diagnosis: hypercholesterolemia Past medical history: cervical radiculopathy Psychosocial issues/ stress concerns: none  Preferred learning method:  Auditory Visual Hands-on   Current weight: 167.0lbs with shoes Height: 5'3" BMI: 29.58 Medications, supplements: reconciled list in medical record  Progress and evaluation:  Recent labwork indicates total cholesterol 246,  HDL 61, LDL 154, Triglycerides 168 (05/17/21) Patient states she does not eat sweets on a regular basis; occasional pound cake, sugar cookie, or ice cream. She has been making changes to limit saturated fat and include healthy fats, fish, nuts, seeds, beans, whole grains, vegetables and fruits She had been taking rosuvastatin and saw improvement in lipids to goal levels, felt it was due to lifestyle changes, so stopped taking the medication. Then lipids increased again. She has restarted the rosuvastatin now.    Physical activity: walking, calisthenics, hand weights 30 minutes, 2-3 times a week  Dietary Intake:  Usual eating pattern includes 2-3 meals and 1-2 snacks per day. Dining out frequency: 2 meals per week.  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 catered at work; usually salad brought from home with romaine/ spinach + tomato, green pepper, egg, pineapple, dried cranberry and nut topping, occ with grilled chicken breast; pasta salad with veg and light italian dressing Snack: same as am or none Supper: chicken or fish, occ pork chop in air fryer + brocc, green beans, brussels sprouts, zuccini/ squash + brown rice limits potatoes, occ sweet  potato with light butter, cinn sugar; wrap keto friendly whole wheat with chicken, salad greens slight ranch; occ lasagna or spaghetti per daughter request; uses whole wheat penne Snack: occasionally popcorn popped on stove Beverages: water, Aha flavored sparkling water  Nutrition Care Education: Topics covered:  Basic nutrition: basic food groups, appropriate nutrient balance, appropriate meal and snack schedule, general nutrition guidelines    Weight control: estimated energy needs for weight loss 1200-1500kcal -- 6-8oz protein foods, 9-11 servings carb foods, 6-7 tsp added fats daily; provided guidance for low fat, low sugar balanced meals Hyperlipidemia:  target goals for lipids; healthy and unhealthy fats, low fat food choices; role of soluble fiber and high fiber foods; plant sterols/ stanols and food sources; role of exercise   Nutritional Diagnosis:  [place]-2.2 Altered nutrition-related laboratory As related to hypercholesterolemia.  As evidenced by elevated total cholesterol and LDL.  Intervention:  Instruction and discussion as noted above. Patient has been making healthy and appropriate lifestyle changes, and is motivated to continue. She will continue to work on more structured exercise, and maintaining her current healthy eating habits. She will plan to schedule MNT follow up at a later time.  Education Materials given:  Designer, industrial/product with food lists, sample meal pattern Planning A Balanced Meal Foods to Choose to Lower Cholesterol (AHA)    Learner/ who was taught:  Patient   Level of understanding: Verbalizes/ demonstrates competency   Demonstrated degree of understanding via:   Teach back Learning barriers: None  Willingness to learn/ readiness for change: Eager, change in progress   Monitoring and Evaluation:  Dietary intake, exercise, blood lipids, and body weight      follow up: to be scheduled in 6-8 weeks

## 2021-08-16 ENCOUNTER — Telehealth: Payer: Self-pay | Admitting: Family Medicine

## 2021-08-16 NOTE — Telephone Encounter (Signed)
Spoke with patient to let her know she needed to follow up with the Provider who prescribed the medication to her. She stated she just wanted Dr Carlynn Purl to be aware as well.

## 2021-08-16 NOTE — Telephone Encounter (Signed)
Pt called back to schedule appt. However, wanted to let Dr. Carlynn Purl know that she did not complete the predniSONE (STERAPRED UNI-PAK 21 TAB) 10 MG (21) TBPK tablet [[id number]] It made her very sick. Please advise

## 2021-08-16 NOTE — Telephone Encounter (Signed)
Lvm for pt to call back and schedule an appt 

## 2021-08-16 NOTE — Telephone Encounter (Signed)
patient called in to see if Dr Carlynn Purl is suggesting a referral for a neurosurgeon for her. Please call back

## 2021-08-16 NOTE — Telephone Encounter (Signed)
Pt has an appt on 08/25/21.

## 2021-08-25 ENCOUNTER — Telehealth: Payer: 59 | Admitting: Family Medicine

## 2021-09-04 DIAGNOSIS — Z419 Encounter for procedure for purposes other than remedying health state, unspecified: Secondary | ICD-10-CM | POA: Diagnosis not present

## 2021-09-14 ENCOUNTER — Encounter: Payer: Self-pay | Admitting: Family Medicine

## 2021-09-14 ENCOUNTER — Telehealth (INDEPENDENT_AMBULATORY_CARE_PROVIDER_SITE_OTHER): Payer: 59 | Admitting: Family Medicine

## 2021-09-14 DIAGNOSIS — M5412 Radiculopathy, cervical region: Secondary | ICD-10-CM | POA: Diagnosis not present

## 2021-09-14 DIAGNOSIS — G44229 Chronic tension-type headache, not intractable: Secondary | ICD-10-CM | POA: Diagnosis not present

## 2021-09-14 DIAGNOSIS — Z566 Other physical and mental strain related to work: Secondary | ICD-10-CM | POA: Diagnosis not present

## 2021-09-14 MED ORDER — AMITRIPTYLINE HCL 10 MG PO TABS: 10.0000 mg | ORAL_TABLET | Freq: Every day | ORAL | 0 refills | Status: DC

## 2021-09-14 NOTE — Progress Notes (Signed)
Name: Cynthia Woods   MRN: [id number]    DOB: [birth year]   Date:09/14/2021       Progress Note  Subjective  Chief Complaint  Leave of Absence   I connected with  [name] on 09/14/21 at 11:40 AM EDT by telephone and verified that I am speaking with the correct person using two identifiers.  I discussed the limitations, risks, security and privacy concerns of performing an evaluation and management service by telephone and the availability of in person appointments. Staff also discussed with the patient that there may be a patient responsible charge related to this service. Patient agreed on having a virtual visit  Patient Location: at work  Provider Location: [facility]  Additional Individuals present: alone   HPI    Headache: she has a long history of tension headaches and migraine . She is now willing to try  taking preventive medication. She states recently she has been more stress at work. She states she had a MRI done in 2007 because brain tumors occur in her family ( mother, grandmother and aunt) . She states this episodes has been dull, temporal area, sometimes radiates to her neck, Good powder makes headache resolve, she recently went to urgent care and was diagnosed with radiculitis, given tramadol, prednisone and baclofen. 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 She is also upset because she was supposed to get a raise in the new position but it never happened. She feels like the stress causing her to have more headaches and wants to take a leave. Explained I will have to refer her to psychiatrist/therapist to assist with the . She has never been diagnosed with depression or anxiety but  states she cries frequently and keeps everything in. She does not like going to work , it makes her feel resentful that her co-worker gets paid more and she is working more   Patient Active Problem List   Diagnosis Date Noted   Pure hypercholesterolemia 05/17/2021   Closed displaced fracture of distal phalanx of right thumb 10/12/2020   Elevated BP without diagnosis of hypertension 10/12/2020   Tension headache, chronic 07/03/2018   Migraine without aura and without status migrainosus, not intractable 07/03/2018   Shoulder pain, right 05/02/2018   Cervical radiculopathy 07/20/2017   Breast mass, right 09/09/2014   Benign breast cyst in female 09/09/2014    Past Surgical History:  Procedure Laterality Date   COLONOSCOPY WITH PROPOFOL N/A 11/27/2019   Procedure: COLONOSCOPY WITH PROPOFOL;  Surgeon: Earline Mayotte, MD;  Location: [facility] ENDOSCOPY;  Service: Endoscopy;  Laterality: N/A;   FOOT SURGERY  2009   GYNECOLOGIC CRYOSURGERY  1993   WISDOM TOOTH EXTRACTION  2011    Family History  Problem Relation Age of Onset   Other Mother        brain tumor   Cancer Paternal Grandfather        colon   Other Maternal Aunt        brain tumor   Other Maternal Grandmother        brain tumor   Cancer Maternal Grandmother    Diabetes Maternal Grandfather    Hypertension Paternal Grandmother  Social History   Socioeconomic History   Marital status: Single    Spouse name: Not on file   Number of children: 3   Years of education: Not on file   Highest education level: Not on file  Occupational History    Comment: part   Tobacco Use   Smoking status: Never   Smokeless tobacco: Never  Vaping Use   Vaping Use: Never used  Substance and Sexual Activity   Alcohol use: Yes    Alcohol/week: 1.0 standard drink    Types: 1 Standard drinks or equivalent per week    Comment: 3x/mo   Drug use: No   Sexual activity: Yes    Partners: Male    Birth control/protection: None  Other  Topics Concern   Not on file  Social History Narrative   She used to work at [employer] but had to quit after she settled her [employer]'s comp claim in August 2019    Currently working at [employer] job also has a [employer] part time job    She has 3 children at home   Social Determinants of Corporate investment banker Strain: Medium Risk   Difficulty of Paying Living Expenses: Somewhat hard  Food Insecurity: Geophysicist/field seismologist Present   Worried About Programme researcher, broadcasting/film/video in the Last Year: Sometimes true   Barista in the Last Year: Sometimes true  Transportation Needs: No Transportation Needs   Lack of Transportation (Medical): No   Lack of Transportation (Non-Medical): No  Physical Activity: Sufficiently Active   Days of Exercise per Week: 5 days   Minutes of Exercise per Session: 30 min  Stress: No Stress Concern Present   Feeling of Stress : Only a little  Social Connections: Moderately Integrated   Frequency of Communication with Friends and Family: More than three times a week   Frequency of Social Gatherings with Friends and Family: Twice a week   Attends Religious Services: 1 to 4 times per year   Active Member of [employer] or Organizations: Yes   Attends Banker Meetings: 1 to 4 times per year   Marital Status: Never married  Catering manager Violence: Not At Risk   Fear of Current or Ex-Partner: No   Emotionally Abused: No   Physically Abused: No   Sexually Abused: No     Current Outpatient Medications:    cholecalciferol (VITAMIN D) 1000 units tablet, Take 1,000 Units by mouth daily., Disp: , Rfl:    cyanocobalamin 1000 MCG tablet, Take by mouth., Disp: , Rfl:    Multiple Vitamins-Minerals (WOMENS MULTIVITAMIN PO), Take by mouth., Disp: , Rfl:    rosuvastatin (CRESTOR) 10 MG tablet, Take 1 tablet (10 mg total) by mouth daily., Disp: 90 tablet, Rfl: 1  Allergies  Allergen Reactions   Shellfish Allergy Anaphylaxis   Sulfa Antibiotics Anaphylaxis   Naproxen  Diarrhea   Oxycodone    Latex Rash   Tramadol Nausea Only    nausea    I personally reviewed active problem list, medication list, allergies, family history, social history, health maintenance with the patient/caregiver today.   ROS  Ten systems reviewed and is negative except as mentioned in HPI   Objective  Virtual encounter, vitals not obtained.  There is no height or weight on file to calculate BMI.  Physical Exam  Awake, alert and oriented   PHQ2/9: Depression screen [facility] 2/9 09/14/2021 08/12/2021 05/17/2021 10/12/2020 09/30/2020  Decreased Interest 0 0 0 0 0  Down,  Depressed, Hopeless 1 0 0 0 0  PHQ - 2 Score 1 0 0 0 0  Altered sleeping 0 - 0 0 -  Tired, decreased energy 1 - 0 0 -  Change in appetite 0 - 0 0 -  Feeling bad or failure about yourself  3 - 0 0 -  Trouble concentrating 0 - 0 0 -  Moving slowly or fidgety/restless 0 - 0 0 -  Suicidal thoughts 0 - 0 0 -  PHQ-9 Score 5 - 0 0 -  Difficult doing work/chores Somewhat difficult - Not difficult at all - -  Some recent data might be hidden   PHQ-2/9 Result is negative.    Fall Risk: Fall Risk  09/14/2021 08/12/2021 05/17/2021 10/12/2020 09/30/2020  Falls in the past year? 0 0 0 0 0  Comment - - - - -  Number falls in past yr: 0 - 0 0 0  Injury with Fall? 0 - 0 0 0  Comment - - - - -  Risk for fall due to : No Fall Risks - - - -  Follow up Falls prevention discussed - - - -     Assessment & Plan  1. Stress at work  - Ambulatory referral to Psychiatry  She asked me to write a letter for her to be out of work, explained that it may not be covered by short term disability since her symptoms are not severe, but she states she is not worried about not getting paid.   2. Chronic tension-type headache, not intractable  - amitriptyline (ELAVIL) 10 MG tablet; Take 1-2 tablets (10-20 mg total) by mouth at bedtime.  Dispense: 60 tablet; Refill: 0  3. Cervical radiculitis   Neck is better  I discussed the  assessment and treatment plan with the patient. The patient was provided an opportunity to ask questions and all were answered. The patient agreed with the plan and demonstrated an understanding of the instructions.   The patient was advised to call back or seek an in-person evaluation if the symptoms worsen or if the condition fails to improve as anticipated.  I provided 25  minutes of non-face-to-face time during this encounter.  Alba Cory, MD

## 2021-09-28 ENCOUNTER — Other Ambulatory Visit: Payer: Self-pay | Admitting: Family Medicine

## 2021-09-28 DIAGNOSIS — G44229 Chronic tension-type headache, not intractable: Secondary | ICD-10-CM

## 2021-10-04 DIAGNOSIS — R87612 Low grade squamous intraepithelial lesion on cytologic smear of cervix (LGSIL): Secondary | ICD-10-CM | POA: Diagnosis not present

## 2021-10-04 DIAGNOSIS — R87619 Unspecified abnormal cytological findings in specimens from cervix uteri: Secondary | ICD-10-CM | POA: Diagnosis not present

## 2021-10-04 DIAGNOSIS — Z3202 Encounter for pregnancy test, result negative: Secondary | ICD-10-CM | POA: Diagnosis not present

## 2021-10-04 DIAGNOSIS — Z8742 Personal history of other diseases of the female genital tract: Secondary | ICD-10-CM | POA: Diagnosis not present

## 2021-10-05 DIAGNOSIS — Z419 Encounter for procedure for purposes other than remedying health state, unspecified: Secondary | ICD-10-CM | POA: Diagnosis not present

## 2021-10-13 ENCOUNTER — Encounter: Payer: Self-pay | Admitting: Family Medicine

## 2021-10-13 ENCOUNTER — Other Ambulatory Visit: Payer: Self-pay | Admitting: General Surgery

## 2021-10-13 DIAGNOSIS — N6009 Solitary cyst of unspecified breast: Secondary | ICD-10-CM

## 2021-10-13 DIAGNOSIS — Z113 Encounter for screening for infections with a predominantly sexual mode of transmission: Secondary | ICD-10-CM

## 2021-10-13 NOTE — Progress Notes (Unsigned)
[id number]

## 2021-11-04 DIAGNOSIS — Z419 Encounter for procedure for purposes other than remedying health state, unspecified: Secondary | ICD-10-CM | POA: Diagnosis not present

## 2021-11-12 ENCOUNTER — Ambulatory Visit: Payer: 59 | Admitting: Family Medicine

## 2021-11-23 ENCOUNTER — Ambulatory Visit: Payer: 59 | Admitting: Family Medicine

## 2021-12-05 DIAGNOSIS — Z419 Encounter for procedure for purposes other than remedying health state, unspecified: Secondary | ICD-10-CM | POA: Diagnosis not present

## 2021-12-07 ENCOUNTER — Ambulatory Visit
Admission: RE | Admit: 2021-12-07 | Discharge: 2021-12-07 | Disposition: A | Discharge status: Home / Self Care | Payer: 59 | Source: Ambulatory Visit | Attending: Family Medicine | Admitting: Family Medicine

## 2021-12-07 ENCOUNTER — Other Ambulatory Visit: Payer: Self-pay

## 2021-12-07 DIAGNOSIS — Z1231 Encounter for screening mammogram for malignant neoplasm of breast: Secondary | ICD-10-CM | POA: Insufficient documentation

## 2022-01-04 ENCOUNTER — Telehealth: Payer: Self-pay

## 2022-01-04 NOTE — Telephone Encounter (Signed)
Copied from CRM [id number]. Topic: General - Other >> Jan 04, 2022  2:08 PM Pawlus, Maxine Glenn A wrote: Reason for CRM: Pt requested a call back with questions regarding paperwork, please advise.

## 2022-01-04 NOTE — Telephone Encounter (Signed)
I spoke with patient and she was very emotional. She stated she was in the process of obtaining FMLA leave information and needed a note for work for today/tomorrow as she was very overwhelmed and overcome with emotions and unable to function at her workplace. Note provided. Will follow up with patient to schedule appointment for [facility] paperwork as she has decided she needs to take a leave of absence for her mental health.

## 2022-01-05 DIAGNOSIS — Z419 Encounter for procedure for purposes other than remedying health state, unspecified: Secondary | ICD-10-CM | POA: Diagnosis not present

## 2022-01-07 ENCOUNTER — Encounter: Payer: Self-pay | Admitting: Family Medicine

## 2022-01-11 ENCOUNTER — Ambulatory Visit: Payer: 59 | Admitting: Internal Medicine

## 2022-02-02 DIAGNOSIS — Z419 Encounter for procedure for purposes other than remedying health state, unspecified: Secondary | ICD-10-CM | POA: Diagnosis not present

## 2022-02-08 ENCOUNTER — Encounter: Payer: Self-pay | Admitting: Family Medicine

## 2022-02-09 ENCOUNTER — Other Ambulatory Visit: Payer: Self-pay

## 2022-02-09 ENCOUNTER — Ambulatory Visit (INDEPENDENT_AMBULATORY_CARE_PROVIDER_SITE_OTHER): Payer: 59 | Admitting: Family Medicine

## 2022-02-09 ENCOUNTER — Ambulatory Visit: Payer: 59 | Admitting: Family Medicine

## 2022-02-09 ENCOUNTER — Encounter: Payer: Self-pay | Admitting: Family Medicine

## 2022-02-09 VITALS — BP 112/76 | HR 94 | Temp 98.4°F | Resp 16 | Ht 64.0 in | Wt 166.1 lb

## 2022-02-09 DIAGNOSIS — M79675 Pain in left toe(s): Secondary | ICD-10-CM | POA: Diagnosis not present

## 2022-02-09 NOTE — Progress Notes (Signed)
? ?  SUBJECTIVE:  ? ?CHIEF COMPLAINT / HPI:  ? ?TOE PAIN ?- Was moving into a different apartment last Friday when stubbed bare foot onto metal door threshold with subsequent pain and swelling. Noted to both 2nd toes, L>R. Able to bear weight but with pain, difficult to curl toes. Denies injury to other toes or other parts of feet. Has not attempted any treatments. ? ? ? ?OBJECTIVE:  ? ?BP 112/76   Pulse 94   Temp 98.4 ?F (36.9 ?C) (Oral)   Resp 16   Ht 5\' 4"  (1.626 m)   Wt 166 lb 1.6 oz (75.3 kg)   LMP 01/29/2022 (Exact Date)   SpO2 98%   BMI 28.51 kg/m?   ?Gen: well appearing, in NAD ?Ext: WWP, intact DP pulses b/l. Slight swelling to DIP joint of L 2nd toe, slightly TTP. ? ?Limited 01/31/2022 of L 2nd toe ?Findings: no cortical irregularity, slight spurring of IP joints. ?Impression: no obvious fracture. ? ? ?ASSESSMENT/PLAN:  ? ?Toe pain ?S/p injury with slight swelling on exam with slightly decreased AROM 2/2 pain. No obvious fracture on bedside ultrasound, will obtain XR. Recommend OTC NSAIDs for pain relief as needed.  ? ? ?[place], DO ?

## 2022-02-16 DIAGNOSIS — N760 Acute vaginitis: Secondary | ICD-10-CM | POA: Diagnosis not present

## 2022-02-16 DIAGNOSIS — R5383 Other fatigue: Secondary | ICD-10-CM | POA: Diagnosis not present

## 2022-02-16 DIAGNOSIS — Z3202 Encounter for pregnancy test, result negative: Secondary | ICD-10-CM | POA: Diagnosis not present

## 2022-02-16 DIAGNOSIS — N926 Irregular menstruation, unspecified: Secondary | ICD-10-CM | POA: Diagnosis not present

## 2022-02-16 DIAGNOSIS — Z113 Encounter for screening for infections with a predominantly sexual mode of transmission: Secondary | ICD-10-CM | POA: Diagnosis not present

## 2022-03-05 DIAGNOSIS — Z419 Encounter for procedure for purposes other than remedying health state, unspecified: Secondary | ICD-10-CM | POA: Diagnosis not present

## 2022-03-08 ENCOUNTER — Ambulatory Visit
Admission: RE | Admit: 2022-03-08 | Discharge: 2022-03-08 | Disposition: A | Discharge status: Home / Self Care | Payer: Medicaid Other | Source: Ambulatory Visit | Attending: Family Medicine | Admitting: Family Medicine

## 2022-03-08 ENCOUNTER — Ambulatory Visit
Admission: RE | Admit: 2022-03-08 | Discharge: 2022-03-08 | Disposition: A | Discharge status: Home / Self Care | Payer: Medicaid Other | Attending: Family Medicine | Admitting: Family Medicine

## 2022-03-08 DIAGNOSIS — S99922A Unspecified injury of left foot, initial encounter: Secondary | ICD-10-CM | POA: Diagnosis not present

## 2022-03-08 DIAGNOSIS — M79675 Pain in left toe(s): Secondary | ICD-10-CM | POA: Diagnosis not present

## 2022-03-12 IMAGING — MG MM DIGITAL SCREENING BILAT W/ TOMO AND CAD
8 series · 8 of 24 positions shown · non-contrast
Comparison: None.

CLINICAL DATA: Screening.

EXAM:
DIGITAL SCREENING BILATERAL MAMMOGRAM WITH TOMOSYNTHESIS AND CAD
TECHNIQUE: Bilateral screening digital craniocaudal and mediolateral oblique
mammograms were obtained. Bilateral screening digital breast
tomosynthesis was performed. The images were evaluated with
computer-aided detection.

[R CC synth-2D]
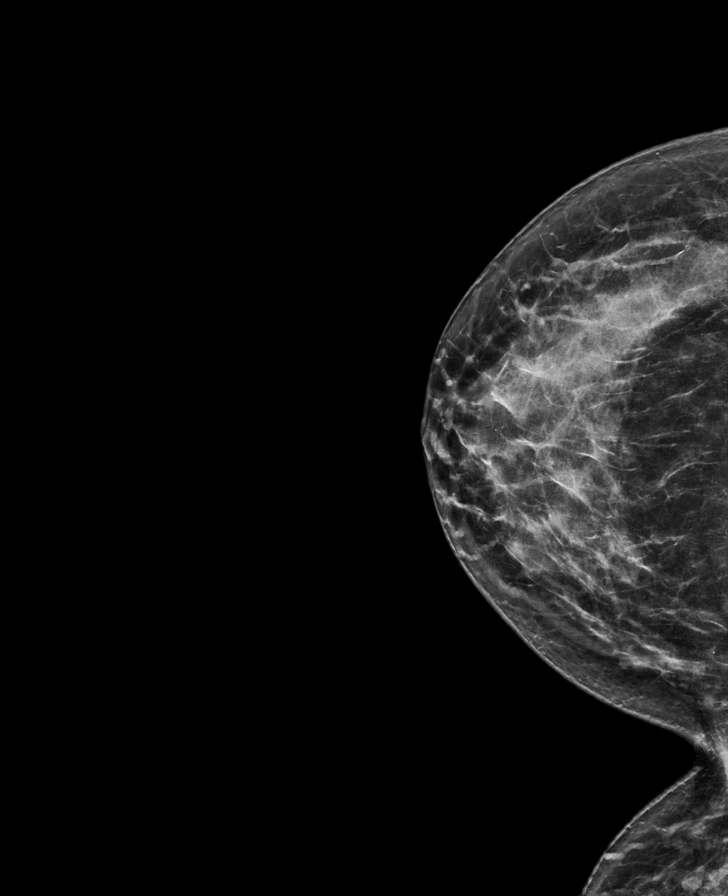

[R MLO synth-2D]
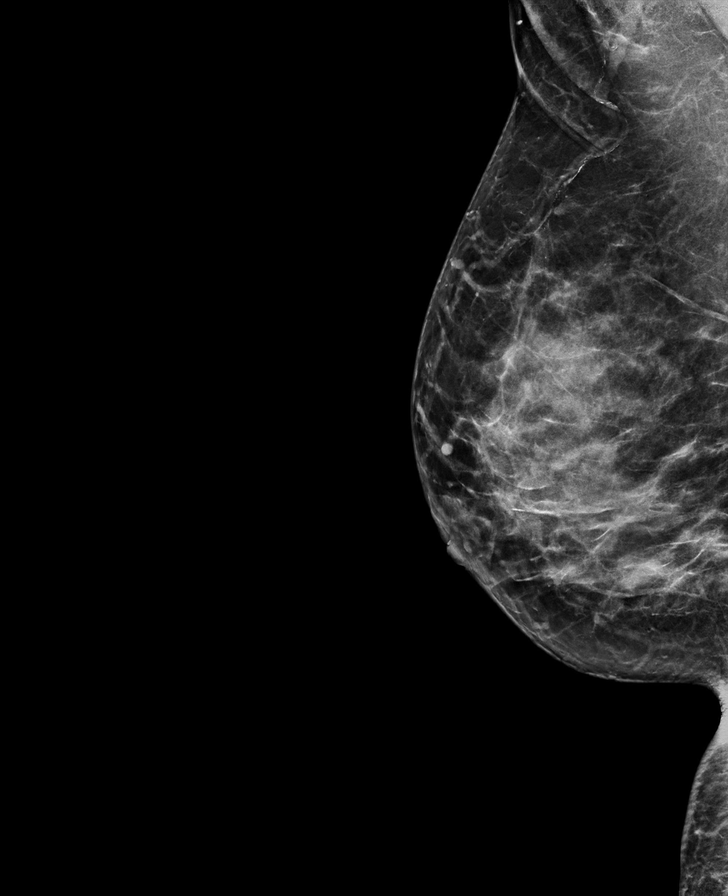

[L CC synth-2D]
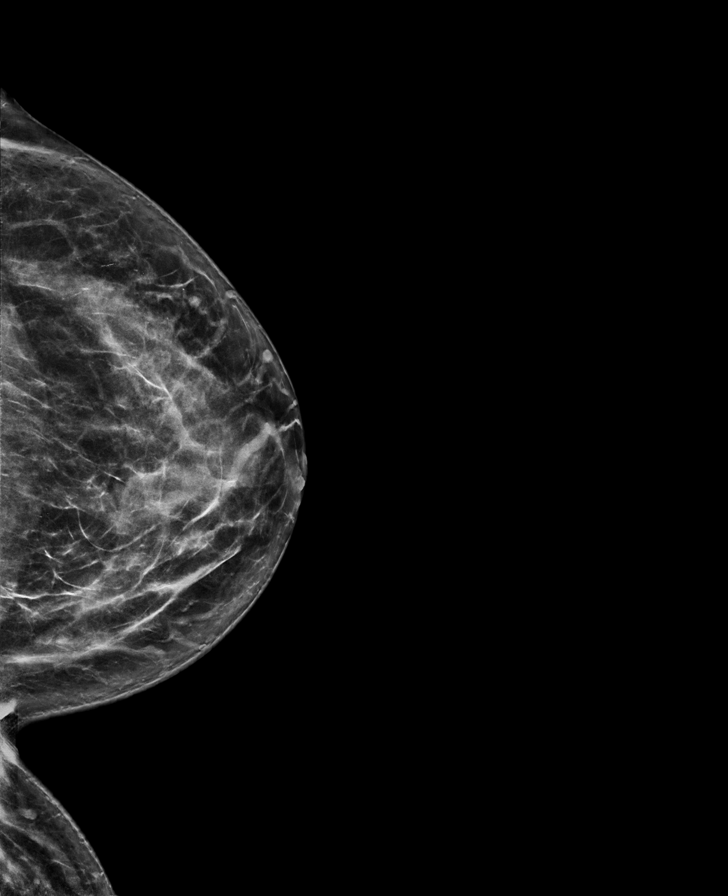

[L MLO synth-2D]
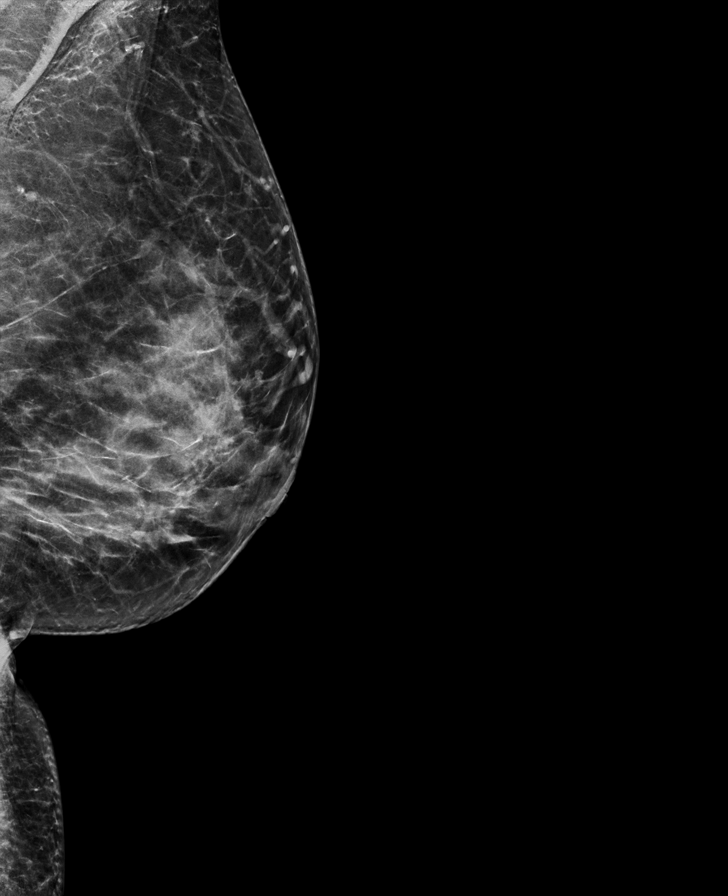

[R MLO tomo · tomo slice 33/66.0]
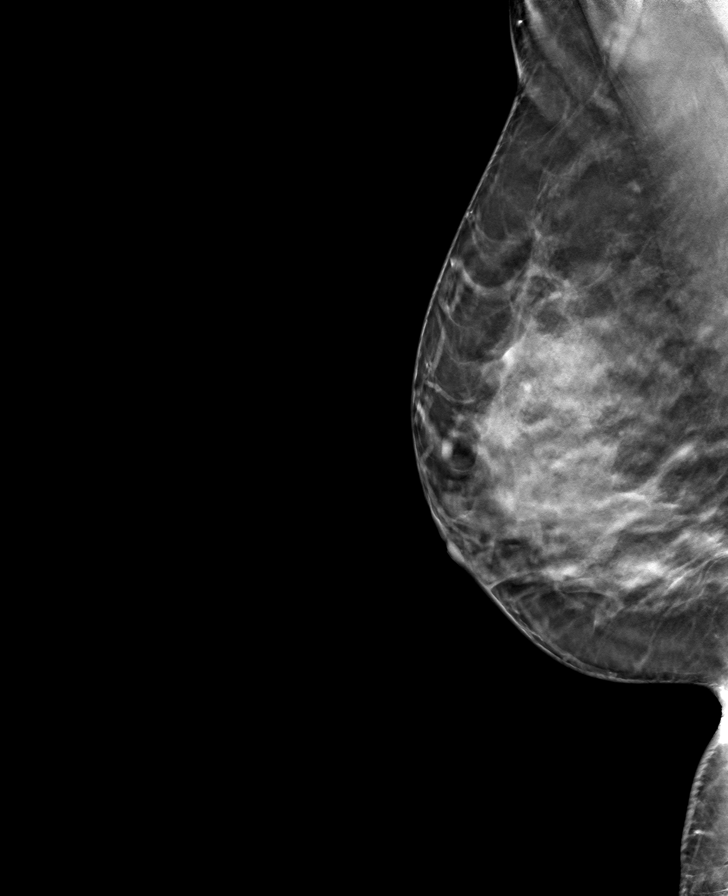

[R CC tomo · tomo slice 34/67.0]
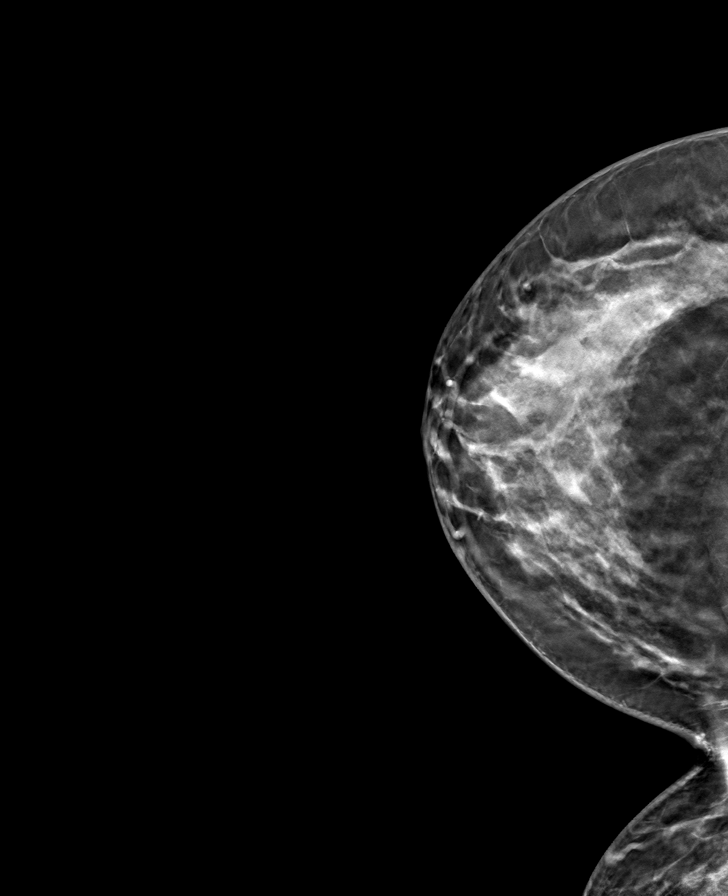

[L MLO tomo · tomo slice 33/66.0]
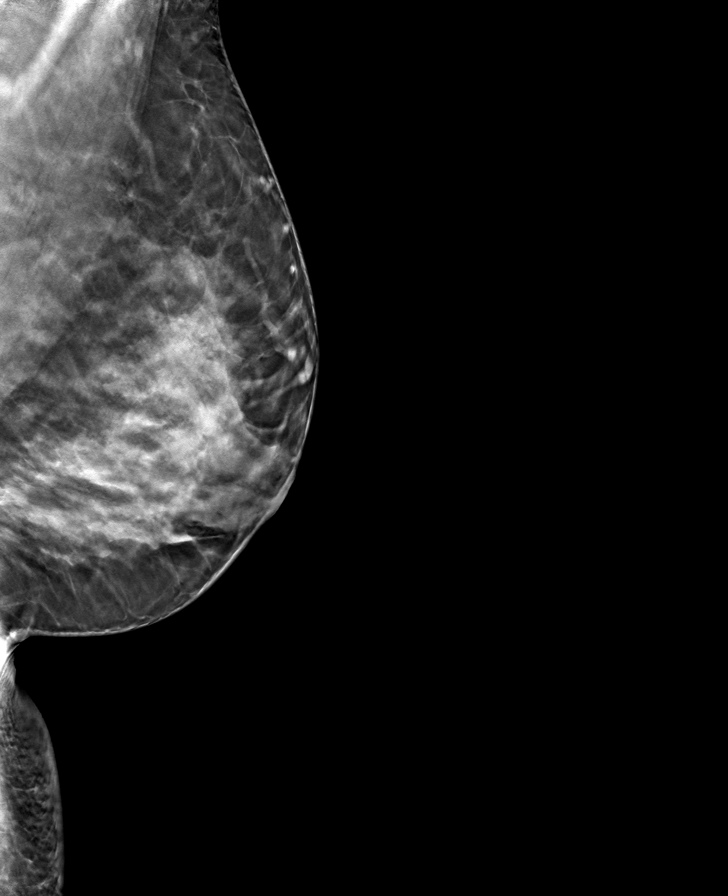

[L CC tomo · tomo slice 33/66.0]
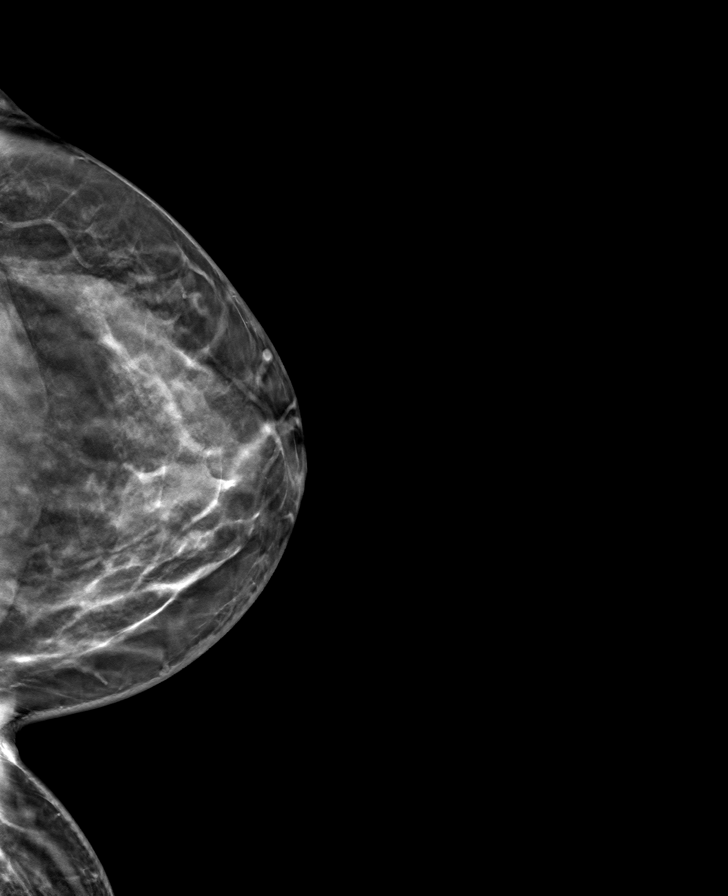

[8 of 24 positions shown; findings below may reference images not displayed]

ACR Breast Density Category c: The breast tissue is heterogeneously
dense, which may obscure small masses
FINDINGS: There are no findings suspicious for malignancy.
IMPRESSION: No mammographic evidence of malignancy. A result letter of this
screening mammogram will be mailed directly to the patient.

RECOMMENDATION:
Screening mammogram in one year. (Code:C8-T-HNK)

BI-RADS CATEGORY  1: Negative.

## 2022-03-14 ENCOUNTER — Ambulatory Visit: Payer: Medicaid Other | Admitting: Advanced Practice Midwife

## 2022-03-14 ENCOUNTER — Encounter: Payer: Self-pay | Admitting: Advanced Practice Midwife

## 2022-03-14 DIAGNOSIS — B9689 Other specified bacterial agents as the cause of diseases classified elsewhere: Secondary | ICD-10-CM

## 2022-03-14 DIAGNOSIS — R87612 Low grade squamous intraepithelial lesion on cytologic smear of cervix (LGSIL): Secondary | ICD-10-CM

## 2022-03-14 DIAGNOSIS — Z113 Encounter for screening for infections with a predominantly sexual mode of transmission: Secondary | ICD-10-CM

## 2022-03-14 DIAGNOSIS — R87619 Unspecified abnormal cytological findings in specimens from cervix uteri: Secondary | ICD-10-CM | POA: Insufficient documentation

## 2022-03-14 LAB — WET PREP FOR TRICH, YEAST, CLUE
Trichomonas Exam: NEGATIVE
Yeast Exam: NEGATIVE

## 2022-03-14 MED ORDER — METRONIDAZOLE 500 MG PO TABS: 500.0000 mg | ORAL_TABLET | Freq: Two times a day (BID) | ORAL | 0 refills | Status: AC

## 2022-03-14 NOTE — Progress Notes (Signed)
Here for testing, c/o discharge.Burt Knack, RN  ?

## 2022-03-14 NOTE — Progress Notes (Signed)
Wet mount reviewed with provider, patient treated for BV per SO.Devetta Hagenow Brewer-Jensen, RN 

## 2022-03-14 NOTE — Progress Notes (Signed)
[facility] Department ? ?STI clinic/screening visit ?[address] ?[place] [address] ?[phone] ? ?Subjective:  ?Cynthia Woods is a 51 y.o. SBF G5P3 nonsmoker female being seen today for an STI screening visit. The patient reports they do have symptoms.  Patient reports that they do not desire a pregnancy in the next year.   They reported they are not interested in discussing contraception today.   ? ?Patient's last menstrual period was 02/25/2022 (exact date). ? ? ?Patient has the following medical conditions:   ?Patient Active Problem List  ? Diagnosis Date Noted  ? 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 Pure hypercholesterolemia 05/17/2021  ? Closed displaced fracture of distal phalanx of right thumb 10/12/2020  ? Elevated BP without diagnosis of hypertension 10/12/2020  ? Tension headache, chronic 07/03/2018  ? Migraine without aura and without status migrainosus, not intractable 07/03/2018  ? Shoulder pain, right 05/02/2018  ? Cervical radiculopathy 07/20/2017  ? Breast mass, right 09/09/2014  ? Benign breast cyst in female 09/09/2014  ? ? ?Chief Complaint  ?Patient presents with  ? SEXUALLY TRANSMITTED DISEASE  ? ? ?HPI ? ?Patient reports c/o white "Elmer glue consistency d/c" x 2-3 days. Took Cipro for possible UTI sxs beginning 03/07/22 x 5 days. LMP 02/25/22. Last sex 03/05/22 without condom; with current partner x 4 years. Last ETOH last night (2 Lemondrops) 3x/mo. Last pap 08/03/21 LGSIL HPV+. 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 States was treated for BV 02/2022 ? ?Last HIV test per patient/review of record was 08/10/11 ?Patient reports last pap was 08/03/2021 LGSIL HPV +  ? ?Screening for MPX risk: ?Does the patient have an unexplained rash? No ?Is the patient MSM? No ?Does the patient endorse multiple sex partners or anonymous sex partners? No ?Did the patient have close or sexual contact with a person diagnosed with MPX? No ?Has the  patient traveled outside the [place] where MPX is endemic? No ?Is there a high clinical suspicion for MPX-- evidenced by one of the following No ? -Unlikely to be chickenpox ? -Lymphadenopathy ? -Rash that present in same phase of evolution on any given body part ?See flowsheet for further details and programmatic requirements.  ? ? ?The following portions of the patient's history were reviewed and updated as appropriate: allergies, current medications, past medical history, past social history, past surgical history and problem list. ? ?Objective:  ?There were no vitals filed for this visit. ? ?Physical Exam ?Vitals and nursing note reviewed.  ?Constitutional:   ?   Appearance: Normal appearance. She is normal weight.  ?HENT:  ?   Head: Normocephalic and atraumatic.  ?   Mouth/Throat:  ?   Mouth: Mucous membranes are moist.  ?   Pharynx: Oropharynx is clear. No oropharyngeal exudate or posterior oropharyngeal erythema.  ?Eyes:  ?   Conjunctiva/sclera: Conjunctivae normal.  ?Pulmonary:  ?   Effort: Pulmonary effort is normal.  ?Abdominal:  ?   General: Abdomen is flat.  ?   Palpations: Abdomen is soft. There is no mass.  ?   Tenderness: There is no abdominal tenderness. There is no rebound.  ?   Comments: Soft without masses or tenderness, good tone  ?Genitourinary: ?   General: Normal vulva.  ?   Exam position: Lithotomy position.  ?   Pubic Area: No rash or pubic lice.   ?   Labia:     ?   Right: No rash or lesion.     ?  Left: No rash or lesion.   ?   Vagina: Vaginal discharge (white creamy leukorrhea, ph<4.5 with sl malodor) present. No erythema, bleeding or lesions.  ?   Cervix: Normal.  ?   Uterus: Normal.   ?   Adnexa: Right adnexa normal and left adnexa normal.  ?   Rectum: Normal.  ?Lymphadenopathy:  ?   Head:  ?   Right side of head: No preauricular or posterior auricular adenopathy.  ?   Left side of head: No preauricular or posterior auricular adenopathy.  ?   Cervical: No cervical adenopathy.  ?   Right  cervical: No superficial, deep or posterior cervical adenopathy. ?   Left cervical: No superficial, deep or posterior cervical adenopathy.  ?   Upper Body:  ?   Right upper body: No supraclavicular or axillary adenopathy.  ?   Left upper body: No supraclavicular or axillary adenopathy.  ?   Lower Body: No right inguinal adenopathy. No left inguinal adenopathy.  ?Skin: ?   General: Skin is warm and dry.  ?   Findings: No rash.  ?Neurological:  ?   Mental Status: She is alert and oriented to person, place, and time.  ? ? ? ?Assessment and Plan:  ?Cynthia Woods is a 51 y.o. female presenting to the [facility] Department for STI screening ? ?1. Screening examination for venereal disease ?Treat wet mount per standing orders ?Immunization nurse consult ?- WET PREP FOR TRICH, YEAST, CLUE ?- Chlamydia/Gonorrhea [facility] ? ?2. Low grade squamous intraepithelial lesion on cytologic smear of cervix (LGSIL) 08/03/21 ?Colpo 10/22 CIN 1 at [facility] ?Needs pap in 1 year ? ? ? ? ? ?No follow-ups on file. ? ?No future appointments. ? ?Alberteen Spindle, CNM ? ?

## 2022-04-04 DIAGNOSIS — Z419 Encounter for procedure for purposes other than remedying health state, unspecified: Secondary | ICD-10-CM | POA: Diagnosis not present

## 2022-04-05 ENCOUNTER — Telehealth: Payer: Self-pay | Admitting: Family Medicine

## 2022-04-05 DIAGNOSIS — B9689 Other specified bacterial agents as the cause of diseases classified elsewhere: Secondary | ICD-10-CM

## 2022-04-05 MED ORDER — METRONIDAZOLE 500 MG PO TABS: 500.0000 mg | ORAL_TABLET | Freq: Two times a day (BID) | ORAL | 0 refills | Status: AC

## 2022-04-05 NOTE — Telephone Encounter (Signed)
Pt calls & states was treated for an infection at her last visit, she took 2 pills & has misplaced her pill bottle. She is requesting a refill to [facility]. ?

## 2022-04-05 NOTE — Telephone Encounter (Signed)
RX sent to listed pharmacy for BV.  Pt reports losing medications for treatment for BV.   ? ? ?Wendi Snipes, FNP ? ?

## 2022-05-05 DIAGNOSIS — Z419 Encounter for procedure for purposes other than remedying health state, unspecified: Secondary | ICD-10-CM | POA: Diagnosis not present

## 2022-06-04 DIAGNOSIS — Z419 Encounter for procedure for purposes other than remedying health state, unspecified: Secondary | ICD-10-CM | POA: Diagnosis not present

## 2022-06-08 DIAGNOSIS — N760 Acute vaginitis: Secondary | ICD-10-CM | POA: Diagnosis not present

## 2022-06-08 DIAGNOSIS — R300 Dysuria: Secondary | ICD-10-CM | POA: Diagnosis not present

## 2022-06-08 DIAGNOSIS — Z113 Encounter for screening for infections with a predominantly sexual mode of transmission: Secondary | ICD-10-CM | POA: Diagnosis not present

## 2022-06-08 DIAGNOSIS — R8279 Other abnormal findings on microbiological examination of urine: Secondary | ICD-10-CM | POA: Diagnosis not present

## 2022-06-11 IMAGING — CR DG FOOT COMPLETE 3+V*L*
1 series · 3 of 3 positions shown · non-contrast
Comparison: None.

CLINICAL DATA: Second toe pain after injury

EXAM:
LEFT FOOT - COMPLETE 3+ VIEW

[Series 1: dg foot complete left · 0.14mm/px · 3 of 3 slices shown]
[im 1/3]
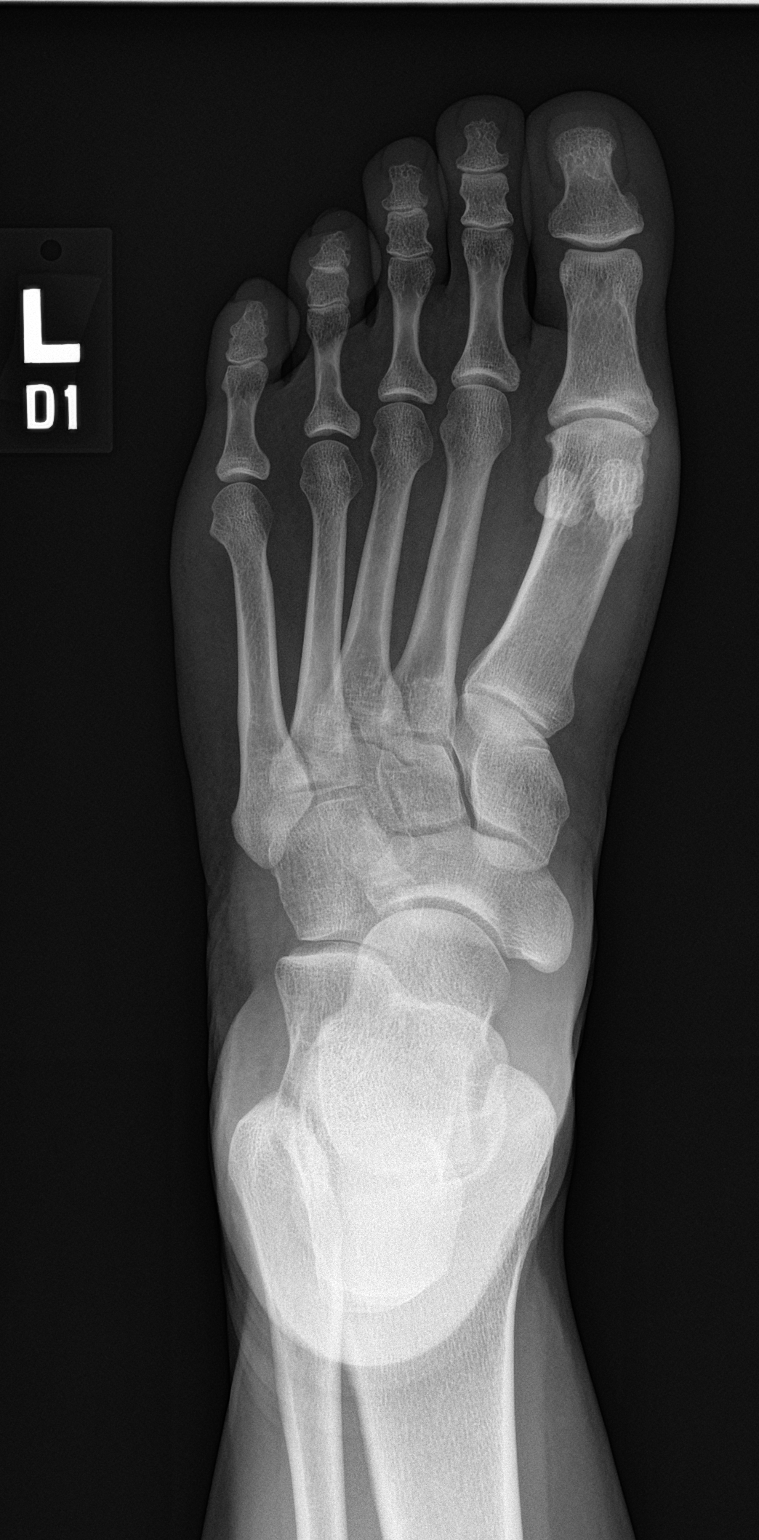
[im 2/3]
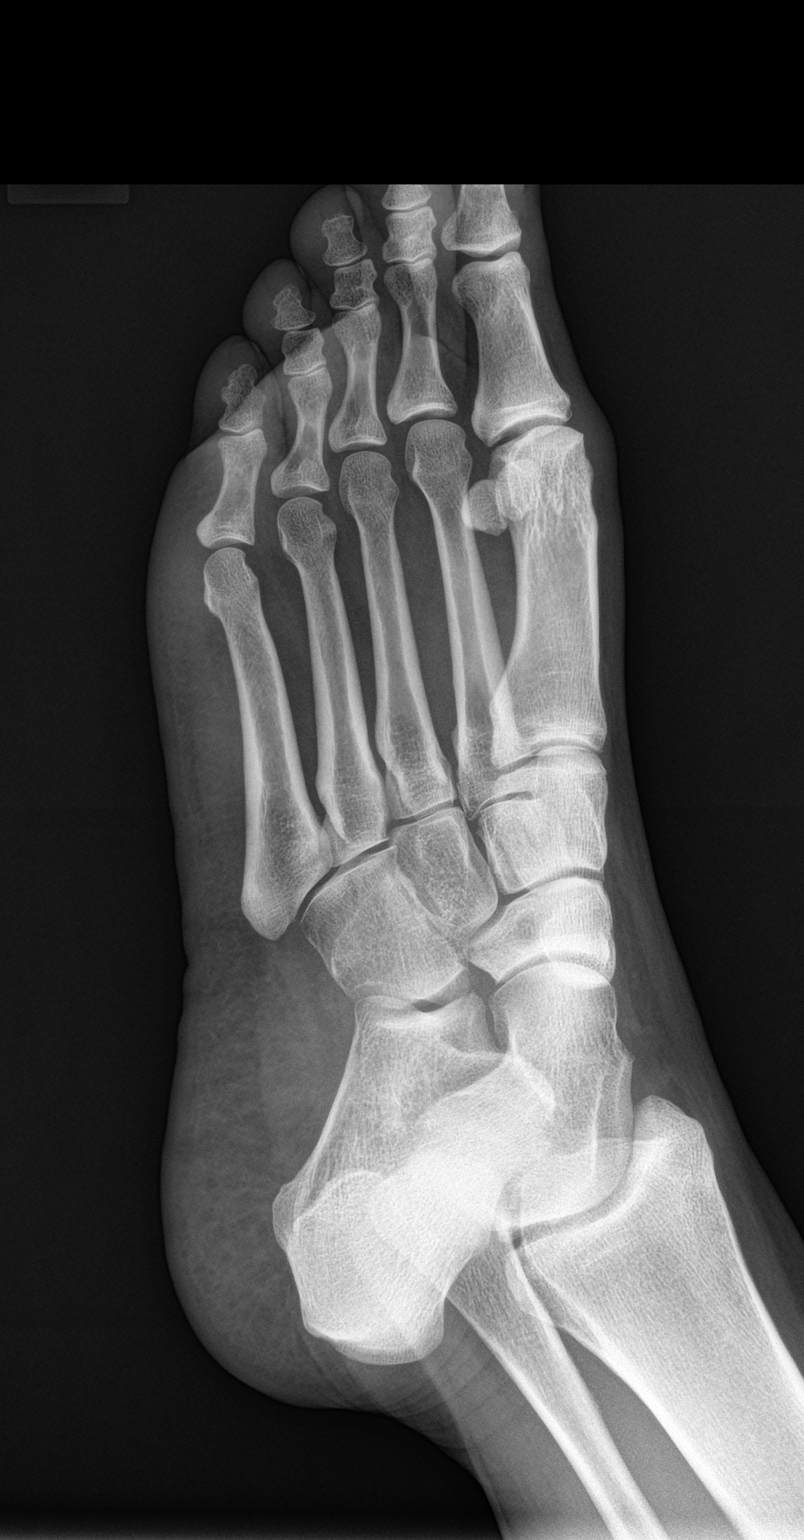
[im 3/3]
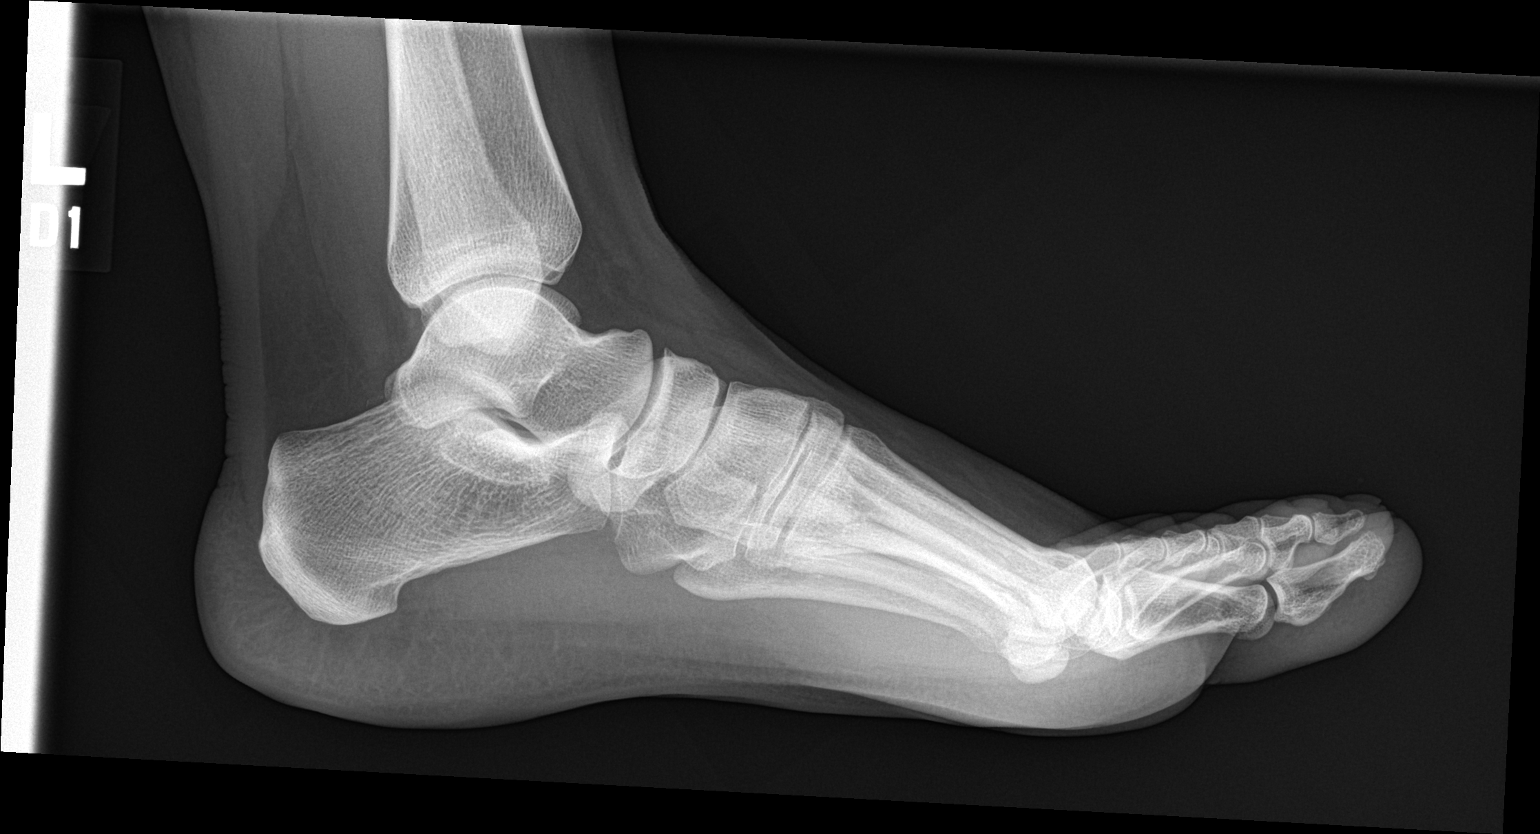

[3 of 3 positions shown; findings below may reference images not displayed]

FINDINGS: Frontal, oblique, and lateral views are obtained. No acute fracture,
subluxation, or dislocation. Joint spaces are well preserved. Soft
tissues are unremarkable.
IMPRESSION: 1. Unremarkable left foot.

## 2022-06-22 DIAGNOSIS — R87619 Unspecified abnormal cytological findings in specimens from cervix uteri: Secondary | ICD-10-CM | POA: Insufficient documentation

## 2022-06-22 NOTE — Progress Notes (Deleted)
Name: Cynthia Woods   MRN: [id number]    DOB: 02-23-1971   Date:06/22/2022       Progress Note  Subjective  Chief Complaint  Pulled muscle   HPI  *** Patient Active Problem List   Diagnosis Date Noted   Abnormal cervical Papanicolaou smear 06/22/2022   Abnormal Pap smear of cervix 08/03/21 LGSIL HPV+ 03/14/2022   Pure hypercholesterolemia 05/17/2021   Closed displaced fracture of distal phalanx of right thumb 10/12/2020   Elevated BP without diagnosis of hypertension 10/12/2020   Tension headache, chronic 07/03/2018   Migraine without aura and without status migrainosus, not intractable 07/03/2018   Shoulder pain, right 05/02/2018   Cervical radiculopathy 07/20/2017   Breast mass, right 09/09/2014   Benign breast cyst in female 09/09/2014    Past Surgical History:  Procedure Laterality Date   BREAST CYST ASPIRATION     COLONOSCOPY WITH PROPOFOL N/A 11/27/2019   Procedure: COLONOSCOPY WITH PROPOFOL;  Surgeon: Earline Mayotte, MD;  Location: [facility] ENDOSCOPY;  Service: Endoscopy;  Laterality: N/A;   FOOT SURGERY  12/06/2007   GYNECOLOGIC CRYOSURGERY  12/06/1991   WISDOM TOOTH EXTRACTION  12/05/2009    Family History  Problem Relation Age of Onset   Other Mother        brain tumor   Other Maternal Aunt        brain tumor   Other Maternal Grandmother        brain tumor   Cancer Maternal Grandmother    Diabetes Maternal Grandfather    Breast cancer Paternal Grandmother    Hypertension Paternal Grandmother    Cancer Paternal Grandfather        colon    Social History   Tobacco Use   Smoking status: Never   Smokeless tobacco: Never  Substance Use Topics   Alcohol use: Yes    Alcohol/week: 2.0 standard drinks of alcohol    Types: 2 Standard drinks or equivalent per week    Comment: last use 03/13/22 3x/mo     Current Outpatient Medications:    cholecalciferol (VITAMIN D) 1000 units tablet, Take 1,000 Units by mouth daily., Disp: , Rfl:    metroNIDAZOLE  (FLAGYL) 500 MG tablet, TAKE 1 TABLET BY MOUTH TWICE A DAY AS DIRECTED FOR 7 DAYS, Disp: , Rfl:    metroNIDAZOLE (METROGEL) 0.75 % vaginal gel, INSERT 1 APPLICATORFUL VAGINALLY EVERY DAY FOR 5 DAYS, Disp: , Rfl:    Multiple Vitamins-Minerals (WOMENS MULTIVITAMIN PO), Take by mouth., Disp: , Rfl:    nitrofurantoin, macrocrystal-monohydrate, (MACROBID) 100 MG capsule, TAKE 1 CAPSULE BY MOUTH EVERY 12 HOURS FOR 7 DAYS, Disp: , Rfl:    rosuvastatin (CRESTOR) 10 MG tablet, Take 1 tablet (10 mg total) by mouth daily., Disp: 90 tablet, Rfl: 1   rosuvastatin (CRESTOR) 10 MG tablet, Take 1 tablet by mouth daily., Disp: , Rfl:   Allergies  Allergen Reactions   Shellfish Allergy Anaphylaxis   Sulfa Antibiotics Anaphylaxis   Latex Rash   Naproxen Diarrhea, Nausea Only and Other (See Comments)   Oxycodone Nausea Only and Nausea And Vomiting   Tramadol Nausea Only and Nausea And Vomiting    nausea    I personally reviewed active problem list, medication list, allergies, family history, social history, health maintenance with the patient/caregiver today.   ROS  ***  Objective  There were no vitals filed for this visit.  There is no height or weight on file to calculate BMI.  Physical Exam ***  No results found  for this or any previous visit (from the past 2160 hour(s)).   PHQ2/9:    02/09/2022    3:44 PM 09/14/2021   11:26 AM 08/12/2021    4:39 PM 05/17/2021    3:37 PM 10/12/2020    7:48 AM  Depression screen PHQ 2/9  Decreased Interest 0 0 0 0 0  Down, Depressed, Hopeless 0 1 0 0 0  PHQ - 2 Score 0 1 0 0 0  Altered sleeping 0 0  0 0  Tired, decreased energy 0 1  0 0  Change in appetite 0 0  0 0  Feeling bad or failure about yourself  0 3  0 0  Trouble concentrating 0 0  0 0  Moving slowly or fidgety/restless 0 0  0 0  Suicidal thoughts 0 0  0 0  PHQ-9 Score 0 5  0 0  Difficult doing work/chores Not difficult at all Somewhat difficult  Not difficult at all     phq 9 is {gen pos  [id number]}   Fall Risk:    02/09/2022    3:44 PM 09/14/2021   11:26 AM 08/12/2021    4:39 PM 05/17/2021    3:37 PM 10/12/2020    7:48 AM  Fall Risk   Falls in the past year? 0 0 0 0 0  Number falls in past yr: 0 0  0 0  Injury with Fall? 0 0  0 0  Risk for fall due to : No Fall Risks No Fall Risks     Follow up Education provided Falls prevention discussed         Functional Status Survey:      Assessment & Plan  *** There are no diagnoses linked to this encounter.

## 2022-06-23 ENCOUNTER — Ambulatory Visit: Payer: 59 | Admitting: Family Medicine

## 2022-06-23 DIAGNOSIS — H11441 Conjunctival cysts, right eye: Secondary | ICD-10-CM | POA: Diagnosis not present

## 2022-06-23 NOTE — Progress Notes (Signed)
Name: Cynthia Woods   MRN: [id number]    DOB: [birth year]   Date:06/24/2022       Progress Note  Subjective  Chief Complaint    HPI  Left upper back spasms: earlier this week, but used tens unit and also heating pad and symptoms are much better now  Hyperlipidemia: she has been taking Crestor almost every day, we will recheck labs today, no side effects.   History of migraine and tension headaches; very worried since mother had brain cancer, maternal grandmother also had same problems and aunt. She is also having a change in her headaches, daily , constant, associated with lightheadedness and also nausea , no focal deficit  she as been taking BC powders prn but not daily. We will check MRI We will also place referral to neurologist for further evaluation of headache  Patient Active Problem List   Diagnosis Date Noted   Vitamin D deficiency 06/24/2022   Back spasm 06/24/2022   Abnormal cervical Papanicolaou smear 06/22/2022   Abnormal Pap smear of cervix 08/03/21 LGSIL HPV+ 03/14/2022   Pure hypercholesterolemia 05/17/2021   Elevated BP without diagnosis of hypertension 10/12/2020   Chronic tension-type headache, not intractable 07/03/2018   Migraine without aura and without status migrainosus, not intractable 07/03/2018   Cervical radiculopathy 07/20/2017   Benign breast cyst in female 09/09/2014    Past Surgical History:  Procedure Laterality Date   BREAST CYST ASPIRATION     COLONOSCOPY WITH PROPOFOL N/A 11/27/2019   Procedure: COLONOSCOPY WITH PROPOFOL;  Surgeon: Earline Mayotte, MD;  Location: [facility] ENDOSCOPY;  Service: Endoscopy;  Laterality: N/A;   FOOT SURGERY  12/06/2007   GYNECOLOGIC CRYOSURGERY  12/06/1991   WISDOM TOOTH EXTRACTION  12/05/2009    Family History  Problem Relation Age of Onset   Other Mother        brain tumor   Other Maternal Aunt        brain tumor   Other Maternal Grandmother        brain tumor   Cancer Maternal Grandmother     Diabetes Maternal Grandfather    Breast cancer Paternal Grandmother    Hypertension Paternal Grandmother    Cancer Paternal Grandfather        colon    Social History   Tobacco Use   Smoking status: Never   Smokeless tobacco: Never  Substance Use Topics   Alcohol use: Yes    Alcohol/week: 2.0 standard drinks of alcohol    Types: 2 Standard drinks or equivalent per week    Comment: last use 03/13/22 3x/mo     Current Outpatient Medications:    cholecalciferol (VITAMIN D) 1000 units tablet, Take 1,000 Units by mouth daily., Disp: , Rfl:    Multiple Vitamins-Minerals (WOMENS MULTIVITAMIN PO), Take by mouth., Disp: , Rfl:    rosuvastatin (CRESTOR) 10 MG tablet, Take 1 tablet (10 mg total) by mouth daily., Disp: 90 tablet, Rfl: 1  Allergies  Allergen Reactions   Shellfish Allergy Anaphylaxis   Sulfa Antibiotics Anaphylaxis   Latex Rash   Naproxen Diarrhea, Nausea Only and Other (See Comments)   Oxycodone Nausea Only and Nausea And Vomiting   Tramadol Nausea Only and Nausea And Vomiting    nausea    I personally reviewed active problem list, medication list, allergies, family history, social history, health maintenance with the patient/caregiver today.   ROS  Ten systems reviewed and is negative except as mentioned in HPI   Objective  Vitals:   06/24/22  1305  BP: 108/72  Pulse: 70  Resp: 12  Temp: 98.1 F (36.7 C)  TempSrc: Oral  SpO2: 99%  Weight: 163 lb 11.2 oz (74.3 kg)  Height: 5\' 4"  (1.626 m)    Body mass index is 28.1 kg/m.  Physical Exam  Constitutional: Patient appears well-developed and well-nourished. No distress.  HEENT: head atraumatic, normocephalic, pupils equal and reactive to light, , neck supple, throat within normal limits Cardiovascular: Normal rate, regular rhythm and normal heart sounds.  No murmur heard. No BLE edema. Pulmonary/Chest: Effort normal and breath sounds normal. No respiratory distress. Abdominal: Soft.  There is no  tenderness. Psychiatric: Patient has a normal mood and affect. behavior is normal. Judgment and thought content normal.  Neurologist: no focal findings, cranial nerve intact.    PHQ2/9:    06/24/2022    1:04 PM 02/09/2022    3:44 PM 09/14/2021   11:26 AM 08/12/2021    4:39 PM 05/17/2021    3:37 PM  Depression screen PHQ 2/9  Decreased Interest 1 0 0 0 0  Down, Depressed, Hopeless 1 0 1 0 0  PHQ - 2 Score 2 0 1 0 0  Altered sleeping 1 0 0  0  Tired, decreased energy 0 0 1  0  Change in appetite 3 0 0  0  Feeling bad or failure about yourself  1 0 3  0  Trouble concentrating 0 0 0  0  Moving slowly or fidgety/restless 0 0 0  0  Suicidal thoughts 0 0 0  0  PHQ-9 Score 7 0 5  0  Difficult doing work/chores Not difficult at all Not difficult at all Somewhat difficult  Not difficult at all    phq 9 is positive - she states situational and does not want medications at this time    Fall Risk:    06/24/2022    1:04 PM 02/09/2022    3:44 PM 09/14/2021   11:26 AM 08/12/2021    4:39 PM 05/17/2021    3:37 PM  Fall Risk   Falls in the past year? 0 0 0 0 0  Number falls in past yr:  0 0  0  Injury with Fall?  0 0  0  Risk for fall due to : No Fall Risks No Fall Risks No Fall Risks    Follow up Falls prevention discussed Education provided Falls prevention discussed        Functional Status Survey: Is the patient deaf or have difficulty hearing?: No Does the patient have difficulty seeing, even when wearing glasses/contacts?: Yes (under eye Dr care) Does the patient have difficulty concentrating, remembering, or making decisions?: No Does the patient have difficulty walking or climbing stairs?: No Does the patient have difficulty dressing or bathing?: No Does the patient have difficulty doing errands alone such as visiting a doctor's office or shopping?: No    Assessment & Plan  1. Pure hypercholesterolemia  - Lipid panel  2. Vitamin D deficiency  - VITAMIN D 25 Hydroxy (Vit-D  Deficiency, Fractures)  3. New daily persistent headache  - MR Brain W Wo Contrast; Future - TSH - Ambulatory referral to Neurology  4. Diabetes mellitus screening  - Hemoglobin A1c  5. Back spasm  Doing better   6. Family history of brain cancer  - MR Brain W Wo Contrast; Future - Ambulatory referral to Neurology  7. Screening for deficiency anemia  - CBC with Differential/Platelet  8. Long-term use of high-risk medication  -  COMPLETE METABOLIC PANEL WITH GFR

## 2022-06-24 ENCOUNTER — Encounter: Payer: Self-pay | Admitting: Family Medicine

## 2022-06-24 ENCOUNTER — Ambulatory Visit: Payer: BC Managed Care – PPO | Admitting: Family Medicine

## 2022-06-24 VITALS — BP 108/72 | HR 70 | Temp 98.1°F | Resp 12 | Ht 64.0 in | Wt 163.7 lb

## 2022-06-24 DIAGNOSIS — Z130 Encounter for screening for diseases of the blood and blood-forming organs and certain disorders involving the immune mechanism: Secondary | ICD-10-CM

## 2022-06-24 DIAGNOSIS — Z808 Family history of malignant neoplasm of other organs or systems: Secondary | ICD-10-CM

## 2022-06-24 DIAGNOSIS — G4452 New daily persistent headache (NDPH): Secondary | ICD-10-CM | POA: Diagnosis not present

## 2022-06-24 DIAGNOSIS — E559 Vitamin D deficiency, unspecified: Secondary | ICD-10-CM

## 2022-06-24 DIAGNOSIS — E7800 Pure hypercholesterolemia, unspecified: Secondary | ICD-10-CM | POA: Diagnosis not present

## 2022-06-24 DIAGNOSIS — Z79899 Other long term (current) drug therapy: Secondary | ICD-10-CM | POA: Diagnosis not present

## 2022-06-24 DIAGNOSIS — Z131 Encounter for screening for diabetes mellitus: Secondary | ICD-10-CM | POA: Diagnosis not present

## 2022-06-24 DIAGNOSIS — M62830 Muscle spasm of back: Secondary | ICD-10-CM

## 2022-06-25 LAB — LIPID PANEL
Cholesterol: 196 mg/dL (ref <200)
HDL: 70 mg/dL (ref >=50)
LDL Cholesterol (Calc): 112 mg/dL (calc) — ABNORMAL HIGH
Non-HDL Cholesterol (Calc): 126 mg/dL (calc) (ref <130)
Total CHOL/HDL Ratio: 2.8 (calc) (ref <5.0)
Triglycerides: 62 mg/dL (ref <150)

## 2022-06-25 LAB — HEMOGLOBIN A1C
Hgb A1c MFr Bld: 5.2 % of total Hgb (ref <5.7)
Mean Plasma Glucose: 103 mg/dL
eAG (mmol/L): 5.7 mmol/L

## 2022-06-25 LAB — COMPLETE METABOLIC PANEL WITH GFR
AG Ratio: 1.8 (calc) (ref 1.0–2.5)
ALT: 14 U/L (ref 6–29)
AST: 16 U/L (ref 10–35)
Albumin: 4.4 g/dL (ref 3.6–5.1)
Alkaline phosphatase (APISO): 52 U/L (ref 31–125)
BUN: 11 mg/dL (ref 7–25)
CO2: 26 mmol/L (ref 20–32)
Calcium: 9.5 mg/dL (ref 8.6–10.2)
Chloride: 106 mmol/L (ref 98–110)
Creat: 0.74 mg/dL (ref 0.50–0.99)
Globulin: 2.4 g/dL (calc) (ref 1.9–3.7)
Glucose, Bld: 85 mg/dL (ref 65–99)
Potassium: 4.4 mmol/L (ref 3.5–5.3)
Sodium: 141 mmol/L (ref 135–146)
Total Bilirubin: 0.3 mg/dL (ref 0.2–1.2)
Total Protein: 6.8 g/dL (ref 6.1–8.1)
eGFR: 100 mL/min/{1.73_m2} (ref >=60)

## 2022-06-25 LAB — CBC WITH DIFFERENTIAL/PLATELET
Absolute Monocytes: 357 cells/uL (ref 200–950)
Basophils Absolute: 20 cells/uL (ref 0–200)
Basophils Relative: 0.4 %
Eosinophils Absolute: 71 cells/uL (ref 15–500)
Eosinophils Relative: 1.4 %
HCT: 39.8 % (ref 35.0–45.0)
Hemoglobin: 13.2 g/dL (ref 11.7–15.5)
Lymphs Abs: 2142 cells/uL (ref 850–3900)
MCH: 31.7 pg (ref 27.0–33.0)
MCHC: 33.2 g/dL (ref 32.0–36.0)
MCV: 95.4 fL (ref 80.0–100.0)
MPV: 11.1 fL (ref 7.5–12.5)
Monocytes Relative: 7 %
Neutro Abs: 2509 cells/uL (ref 1500–7800)
Neutrophils Relative %: 49.2 %
Platelets: 217 10*3/uL (ref 140–400)
RBC: 4.17 10*6/uL (ref 3.80–5.10)
RDW: 12.6 % (ref 11.0–15.0)
Total Lymphocyte: 42 %
WBC: 5.1 10*3/uL (ref 3.8–10.8)

## 2022-06-25 LAB — VITAMIN D 25 HYDROXY (VIT D DEFICIENCY, FRACTURES): Vit D, 25-Hydroxy: 29 ng/mL — ABNORMAL LOW (ref 30–100)

## 2022-06-25 LAB — TSH: TSH: 0.84 mIU/L

## 2022-06-26 ENCOUNTER — Encounter: Payer: Self-pay | Admitting: Family Medicine

## 2022-06-27 ENCOUNTER — Encounter: Payer: Self-pay | Admitting: Family Medicine

## 2022-06-28 ENCOUNTER — Other Ambulatory Visit: Payer: Self-pay | Admitting: Family Medicine

## 2022-06-28 MED ORDER — VITAMIN D 50 MCG (2000 UT) PO CAPS: 1.0000 | ORAL_CAPSULE | Freq: Every day | ORAL | 3 refills | Status: AC

## 2022-06-28 MED ORDER — ROSUVASTATIN CALCIUM 20 MG PO TABS: 20.0000 mg | ORAL_TABLET | Freq: Every day | ORAL | 3 refills | Status: DC

## 2022-06-29 ENCOUNTER — Encounter: Payer: Self-pay | Admitting: Family Medicine

## 2022-07-01 ENCOUNTER — Telehealth: Payer: Self-pay | Admitting: Family Medicine

## 2022-07-01 ENCOUNTER — Encounter: Payer: Self-pay | Admitting: Family Medicine

## 2022-07-01 ENCOUNTER — Other Ambulatory Visit: Payer: Self-pay | Admitting: Family Medicine

## 2022-07-01 DIAGNOSIS — M62838 Other muscle spasm: Secondary | ICD-10-CM

## 2022-07-01 MED ORDER — BACLOFEN 10 MG PO TABS: 10.0000 mg | ORAL_TABLET | Freq: Three times a day (TID) | ORAL | 0 refills | Status: DC

## 2022-07-01 NOTE — Telephone Encounter (Signed)
Pt has been notified.

## 2022-07-01 NOTE — Telephone Encounter (Signed)
Copied from CRM #[id number]. Topic: General - Other >> Jul 01, 2022  9:54 AM Turkey B wrote: Reason for RNH:[id number] sent mychart message and called to check on it requesting med for back spasm. I was told no appt available today and Dr Carlynn Purl will get back to her asap on next steps

## 2022-07-01 NOTE — Telephone Encounter (Signed)
Pt called back saying she is in a lot of pain with back muscle spasms.  She sent a message and has not heard anything back.  Please advise  [phone]

## 2022-07-01 NOTE — Telephone Encounter (Unsigned)
Copied from CRM [id number]. Topic: General - Other >> Jul 01, 2022  4:41 PM Everette C wrote: Reason for CRM: Medication Refill - Medication: baclofen (LIORESAL) 10 MG tablet [[id number]]   Has the patient contacted their pharmacy? Yes.  The patient has ben in contact with their pharmacy and been directed to contact their PCP  (Agent: If no, request that the patient contact the pharmacy for the refill. If patient does not wish to contact the pharmacy document the reason why and proceed with request.) (Agent: If yes, when and what did the pharmacy advise?)  Preferred Pharmacy (with phone number or street name): [facility] [phone] [facility] [facility] [address] [place] [address] Phone: [phone] Fax: [phone] Hours: Not open 24 hours   Has the patient been seen for an appointment in the last year OR does the patient have an upcoming appointment? Yes.    Agent: Please be advised that RX refills may take up to 3 business days. We ask that you follow-up with your pharmacy.

## 2022-07-01 NOTE — Telephone Encounter (Unsigned)
Copied from CRM [id number]. Topic: General - Other >> Jul 01, 2022  4:38 PM Everette C wrote: Reason for CRM: Medication Refill - Medication: baclofen (LIORESAL) 10 MG tablet [[id number]]   Has the patient contacted their pharmacy? Yes.  The patient has ben in contact with their pharmacy and been directed to contact their PCP  (Agent: If no, request that the patient contact the pharmacy for the refill. If patient does not wish to contact the pharmacy document the reason why and proceed with request.) (Agent: If yes, when and what did the pharmacy advise?)  Preferred Pharmacy (with phone number or street name): [facility] [phone] [facility]im [address] [place] [address] Phone: [phone] Fax: [phone] Hours: Not open 24 hours   Has the patient been seen for an appointment in the last year OR does the patient have an upcoming appointment? Yes.    Agent: Please be advised that RX refills may take up to 3 business days. We ask that you follow-up with your pharmacy.

## 2022-07-02 ENCOUNTER — Other Ambulatory Visit: Payer: Self-pay

## 2022-07-02 ENCOUNTER — Emergency Department
Admission: EM | Admit: 2022-07-02 | Discharge: 2022-07-02 | Disposition: A | Discharge status: Home / Self Care | Payer: BC Managed Care – PPO | Attending: Emergency Medicine | Admitting: Emergency Medicine

## 2022-07-02 ENCOUNTER — Encounter: Payer: Self-pay | Admitting: Emergency Medicine

## 2022-07-02 DIAGNOSIS — M62830 Muscle spasm of back: Secondary | ICD-10-CM | POA: Diagnosis not present

## 2022-07-02 DIAGNOSIS — Z91040 Latex allergy status: Secondary | ICD-10-CM | POA: Insufficient documentation

## 2022-07-02 MED ORDER — NAPROXEN 500 MG PO TABS
500.0000 mg | ORAL_TABLET | Freq: Two times a day (BID) | ORAL | 0 refills | Status: DC
Start: 2022-07-02 — End: 2022-12-02

## 2022-07-02 MED ORDER — LIDOCAINE 5 % EX PTCH: 1.0000 | MEDICATED_PATCH | CUTANEOUS | 0 refills | Status: DC

## 2022-07-02 MED ORDER — ONDANSETRON HCL 4 MG/2ML IJ SOLN
4.0000 mg | Freq: Once | INTRAMUSCULAR | Status: AC
  Administered 2022-07-02: 4 mg via INTRAVENOUS
  Filled 2022-07-02: qty 2

## 2022-07-02 MED ORDER — LIDOCAINE 5 % EX PTCH
1.0000 | MEDICATED_PATCH | CUTANEOUS | Status: DC
  Administered 2022-07-02: 1 via TRANSDERMAL
  Filled 2022-07-02: qty 1

## 2022-07-02 MED ORDER — ONDANSETRON 4 MG PO TBDP: 4.0000 mg | ORAL_TABLET | Freq: Three times a day (TID) | ORAL | 0 refills | Status: DC | PRN

## 2022-07-02 MED ORDER — DIAZEPAM 5 MG/ML IJ SOLN
2.0000 mg | Freq: Once | INTRAMUSCULAR | Status: AC
  Administered 2022-07-02: 2 mg via INTRAVENOUS
  Filled 2022-07-02: qty 2

## 2022-07-02 MED ORDER — OXYCODONE-ACETAMINOPHEN 5-325 MG PO TABS
1.0000 | ORAL_TABLET | Freq: Once | ORAL | Status: AC
  Administered 2022-07-02: 1 via ORAL
  Filled 2022-07-02: qty 1

## 2022-07-02 MED ORDER — KETOROLAC TROMETHAMINE 30 MG/ML IJ SOLN
15.0000 mg | Freq: Once | INTRAMUSCULAR | Status: AC
  Administered 2022-07-02: 15 mg via INTRAVENOUS
  Filled 2022-07-02: qty 1

## 2022-07-02 MED ORDER — OXYCODONE-ACETAMINOPHEN 5-325 MG PO TABS: 1.0000 | ORAL_TABLET | ORAL | 0 refills | Status: DC | PRN

## 2022-07-02 MED ORDER — DIAZEPAM 2 MG PO TABS: 2.0000 mg | ORAL_TABLET | Freq: Three times a day (TID) | ORAL | 0 refills | Status: DC | PRN

## 2022-07-02 NOTE — ED Triage Notes (Signed)
Pt reports back spasms since yesterday morning around 3am, pt reports pain woke her up from her sleep, pt reports called her PCP and got a muscle relaxant and was able to sleep but pain woke her up this morning. Upper back spasms.

## 2022-07-02 NOTE — ED Provider Notes (Signed)
[facility] Provider Note    Event Date/Time   First MD Initiated Contact with Patient 07/02/22 [phone]     (approximate)   History   Back Pain   HPI  Cynthia Woods is a 51 y.o. female who presents to the ED from home with a chief complaint of back spasms.  Patient awoke yesterday morning around 3 AM with left-sided upper back spasms.  Progressively worse throughout the day.  Saw her PCP and prescribed baclofen which allowed her to go to sleep but she awoke this morning with recurrence of spasms.  Denies fall/injury/trauma.  Denies chest pain, shortness of breath, abdominal pain, nausea, vomiting, dizziness.  Denies extremity weakness/numbness/tingling.  Denies bowel or bladder incontinence.     Past Medical History   Past Medical History:  Diagnosis Date   Hyperlipidemia      Active Problem List   Patient Active Problem List   Diagnosis Date Noted   Vitamin D deficiency 06/24/2022   Back spasm 06/24/2022   Abnormal cervical Papanicolaou smear 06/22/2022   Abnormal Pap smear of cervix 08/03/21 LGSIL HPV+ 03/14/2022   Pure hypercholesterolemia 05/17/2021   Elevated BP without diagnosis of hypertension 10/12/2020   Chronic tension-type headache, not intractable 07/03/2018   Migraine without aura and without status migrainosus, not intractable 07/03/2018   Cervical radiculopathy 07/20/2017   Benign breast cyst in female 09/09/2014     Past Surgical History   Past Surgical History:  Procedure Laterality Date   BREAST CYST ASPIRATION     COLONOSCOPY WITH PROPOFOL N/A 11/27/2019   Procedure: COLONOSCOPY WITH PROPOFOL;  Surgeon: Robert Bellow, MD;  Location: [facility] ENDOSCOPY;  Service: Endoscopy;  Laterality: N/A;   FOOT SURGERY  12/06/2007   GYNECOLOGIC CRYOSURGERY  12/06/1991   WISDOM TOOTH EXTRACTION  12/05/2009     Home Medications   Prior to Admission medications   Medication Sig Start Date End Date Taking? Authorizing  Provider  diazepam (VALIUM) 2 MG tablet Take 1 tablet (2 mg total) by mouth every 8 (eight) hours as needed for muscle spasms. 07/02/22  Yes Paulette Blanch, MD  lidocaine (LIDODERM) 5 % Place 1 patch onto the skin daily. Remove & Discard patch within 12 hours or as directed by MD 07/02/22  Yes Paulette Blanch, MD  naproxen (NAPROSYN) 500 MG tablet Take 1 tablet (500 mg total) by mouth 2 (two) times daily with a meal. 07/02/22  Yes Paulette Blanch, MD  ondansetron (ZOFRAN-ODT) 4 MG disintegrating tablet Take 1 tablet (4 mg total) by mouth every 8 (eight) hours as needed for nausea or vomiting. 07/02/22  Yes Paulette Blanch, MD  oxyCODONE-acetaminophen (PERCOCET/ROXICET) 5-325 MG tablet Take 1 tablet by mouth every 4 (four) hours as needed for severe pain. 07/02/22  Yes Paulette Blanch, MD  baclofen (LIORESAL) 10 MG tablet Take 1 tablet (10 mg total) by mouth 3 (three) times daily. 07/01/22   Steele Sizer, MD  Cholecalciferol (VITAMIN D) 50 MCG (2000 UT) CAPS Take 1 capsule (2,000 Units total) by mouth daily. 06/28/22   Steele Sizer, MD  Multiple Vitamins-Minerals (WOMENS MULTIVITAMIN PO) Take by mouth.    [provider]  rosuvastatin (CRESTOR) 20 MG tablet Take 1 tablet (20 mg total) by mouth daily. 06/28/22   Steele Sizer, MD     Allergies  Shellfish allergy, Sulfa antibiotics, Latex, Naproxen, Oxycodone, and Tramadol   Family History   Family History  Problem Relation Age of Onset   Other Mother  brain tumor   Other Maternal Aunt        brain tumor   Other Maternal Grandmother        brain tumor   Cancer Maternal Grandmother    Diabetes Maternal Grandfather    Breast cancer Paternal Grandmother    Hypertension Paternal Grandmother    Cancer Paternal Grandfather        colon     Physical Exam  Triage Vital Signs: ED Triage Vitals  Enc Vitals Group     BP      Pulse      Resp      Temp      Temp src      SpO2      Weight      Height      Head Circumference      Peak  Flow      Pain Score      Pain Loc      Pain Edu?      Excl. in GC?     Updated Vital Signs: BP 98/72   Pulse 69   Temp 98.1 F (36.7 C) (Oral)   Resp 19   Ht 5\' 4"  (1.626 m)   Wt 73 kg   LMP 06/18/2022 (Exact Date)   SpO2 97%   BMI 27.64 kg/m    General: Awake, moderate distress.  Winces and grimaces with waves of spasms. CV:  RRR.  Good peripheral perfusion.  Resp:  Normal effort.  CTA B. Abd:  Nontender.  No distention.  Other:  No spinal tenderness to palpation.  Left trapezius and thoracic paraspinal muscle spasms.   ED Results / Procedures / Treatments  Labs (all labs ordered are listed, but only abnormal results are displayed) Labs Reviewed - No data to display   EKG  None   RADIOLOGY None   Official radiology report(s): No results found.   PROCEDURES:  Critical Care performed: No  Procedures   MEDICATIONS ORDERED IN ED: Medications  lidocaine (LIDODERM) 5 % 1 patch (1 patch Transdermal Patch Applied 07/02/22 0301)  ketorolac (TORADOL) 30 MG/ML injection 15 mg (15 mg Intravenous Given 07/02/22 0256)  ondansetron (ZOFRAN) injection 4 mg (4 mg Intravenous Given 07/02/22 0256)  diazepam (VALIUM) injection 2 mg (2 mg Intravenous Given 07/02/22 0256)  oxyCODONE-acetaminophen (PERCOCET/ROXICET) 5-325 MG per tablet 1 tablet (1 tablet Oral Given 07/02/22 0301)     IMPRESSION / MDM / ASSESSMENT AND PLAN / ED COURSE  I reviewed the triage vital signs and the nursing notes.                             51 year old female presenting with back spasms.  Differential diagnosis includes but is not limited to musculoskeletal strain, electrolyte abnormality, rhabdomyolysis, etc.  I have personally reviewed patient's records and see her telephone message with her PCP from yesterday regarding back spasms.  Patient's presentation is most consistent with acute, uncomplicated illness.  We will administer IV Valium for muscle relaxation, IV ketorolac and oral Percocet  for pain; paired with IV Zofran to prevent nausea.  Will apply lidocaine patch and reassess.  Clinical Course as of 07/02/22 0538  Sat Jul 02, 2022  0321 Patient resting more comfortably in the recliner.  We will continue to monitor and reassess. [JS]  0408 Patient feeling significantly better.  No focal neurological deficits on reexamination.  Asked patient to hold her baclofen.  Will discharge home with  prescriptions for Naprosyn, Percocet, Valium, Zofran and lidocaine patch.  Patient will follow-up with orthopedics.  Strict return precautions given.  Patient verbalizes understanding and agrees with plan of care. [JS]    Clinical Course User Index [JS] Irean Hong, MD     FINAL CLINICAL IMPRESSION(S) / ED DIAGNOSES   Final diagnoses:  Muscle spasm of back     Rx / DC Orders   ED Discharge Orders          Ordered    naproxen (NAPROSYN) 500 MG tablet  2 times daily with meals        07/02/22 0338    oxyCODONE-acetaminophen (PERCOCET/ROXICET) 5-325 MG tablet  Every 4 hours PRN        07/02/22 0338    diazepam (VALIUM) 2 MG tablet  Every 8 hours PRN        07/02/22 0338    lidocaine (LIDODERM) 5 %  Every 24 hours        07/02/22 0338    ondansetron (ZOFRAN-ODT) 4 MG disintegrating tablet  Every 8 hours PRN        07/02/22 9371             Note:  This document was prepared using Dragon voice recognition software and may include unintentional dictation errors.   Irean Hong, MD 07/02/22 [phone]

## 2022-07-02 NOTE — Discharge Instructions (Signed)
Take Naprosyn twice daily x7 days for pain.  You may take medicines as needed for pain and muscle spasms (Percocet/Valium #15).  Take Zofran with these medicines to prevent nausea.  Apply lidocaine patch as directed.  Apply moist heat to affected area several times daily.  Return to the ER for worsening symptoms, persistent vomiting, difficulty breathing or other concerns.

## 2022-07-04 NOTE — Telephone Encounter (Signed)
Requested medications are due for refill today.  unsure  Requested medications are on the active medications list.  yes  Last refill. 07/01/2022 #30 0 refills  Future visit scheduled.   yes  Notes to clinic.  Refilled 07/01/2022 to a different pharmacy    Requested Prescriptions  Pending Prescriptions Disp Refills   baclofen (LIORESAL) 10 MG tablet 30 each 0    Sig: Take 1 tablet (10 mg total) by mouth 3 (three) times daily.     Analgesics:  Muscle Relaxants - baclofen Passed - 07/01/2022  5:36 PM      Passed - Cr in normal range and within 180 days    Creat  Date Value Ref Range Status  06/24/2022 0.74 0.50 - 0.99 mg/dL Final         Passed - eGFR is 30 or above and within 180 days    GFR, Est African American  Date Value Ref Range Status  05/17/2021 102 > OR = 60 mL/min/1.99m Final   GFR, Est Non African American  Date Value Ref Range Status  05/17/2021 88 > OR = 60 mL/min/1.75mFinal   eGFR  Date Value Ref Range Status  06/24/2022 100 > OR = 60 mL/min/1.7367minal    Comment:    The eGFR is based on the CKD-EPI 2021 equation. To calculate  the new eGFR from a previous Creatinine or Cystatin C result, go to https://www.kidney.org/professionals/ kdoqi/gfr%5Fcalculator          Passed - Valid encounter within last 6 months    Recent Outpatient Visits           1 week ago [facility]   4 months [facility]   9 months ago [facility]   1 year ago [facility] general medical [facility]   1 year ago Closed displaced fracture of distal phalanx of right thumb with routine healing, subsequent encounter   [facility]       Future Appointments             In 4 weeks [facility]

## 2022-07-04 NOTE — Telephone Encounter (Signed)
Rx sent 07/01/22

## 2022-07-05 DIAGNOSIS — Z419 Encounter for procedure for purposes other than remedying health state, unspecified: Secondary | ICD-10-CM | POA: Diagnosis not present

## 2022-07-06 ENCOUNTER — Ambulatory Visit: Payer: Medicaid Other | Admitting: Psychiatry

## 2022-07-08 ENCOUNTER — Ambulatory Visit
Admission: RE | Admit: 2022-07-08 | Discharge: 2022-07-08 | Disposition: A | Discharge status: Home / Self Care | Payer: BC Managed Care – PPO | Source: Ambulatory Visit | Attending: Family Medicine | Admitting: Family Medicine

## 2022-07-08 ENCOUNTER — Telehealth: Payer: Self-pay | Admitting: Family Medicine

## 2022-07-08 DIAGNOSIS — G4452 New daily persistent headache (NDPH): Secondary | ICD-10-CM | POA: Diagnosis not present

## 2022-07-08 DIAGNOSIS — Z808 Family history of malignant neoplasm of other organs or systems: Secondary | ICD-10-CM

## 2022-07-08 DIAGNOSIS — R519 Headache, unspecified: Secondary | ICD-10-CM | POA: Diagnosis not present

## 2022-07-08 MED ORDER — GADOBUTROL 1 MMOL/ML IV SOLN
7.5000 mL | Freq: Once | INTRAVENOUS | Status: AC | PRN
  Administered 2022-07-08: 7.5 mL via INTRAVENOUS

## 2022-07-08 NOTE — Telephone Encounter (Signed)
Called and left voicemail for call back

## 2022-07-08 NOTE — Telephone Encounter (Signed)
Copied from CRM [id number]. Topic: General - Other >> Jul 08, 2022  1:21 PM Lyman Speller wrote: Reason for CRM: Pt would like to speak with Dr. Carlynn Purl about the MRI results and something she doesn't understand that Dr. Carlynn Purl stated in [place] / please advise

## 2022-07-08 NOTE — Telephone Encounter (Signed)
Explained imaging results to patient and answered all questions. She let me know her neurologist moved her appt up to 07/12/22 to see her sooner. Patient gave verbal understanding to results.

## 2022-07-12 ENCOUNTER — Ambulatory Visit (INDEPENDENT_AMBULATORY_CARE_PROVIDER_SITE_OTHER): Payer: BC Managed Care – PPO | Admitting: Psychiatry

## 2022-07-12 VITALS — BP 112/85 | HR 94 | Ht 64.0 in | Wt 162.2 lb

## 2022-07-12 DIAGNOSIS — G43009 Migraine without aura, not intractable, without status migrainosus: Secondary | ICD-10-CM | POA: Diagnosis not present

## 2022-07-12 DIAGNOSIS — E236 Other disorders of pituitary gland: Secondary | ICD-10-CM

## 2022-07-12 MED ORDER — TOPIRAMATE 25 MG PO TABS: ORAL_TABLET | ORAL | 6 refills | Status: DC

## 2022-07-12 MED ORDER — RIZATRIPTAN BENZOATE 10 MG PO TABS: 10.0000 mg | ORAL_TABLET | ORAL | 6 refills | Status: DC | PRN

## 2022-07-12 NOTE — Patient Instructions (Addendum)
Please have eye exam notes sent to our office  Start Topamax for headache prevention. Take 25 mg (1 pill) at bedtime for one week, then increase to 50 mg (2 pills) at bedtime for one week, then take 75 mg (3 pills) at bedtime for one week, then take 100 mg (4 pills) at bedtime  Start rizatriptan as needed for migraines. Take at the onset of migraine. If headache recurs or does not fully resolve, you may take a second dose after 2 hours. Please avoid taking more than 2 days per week to avoid rebound headaches

## 2022-07-12 NOTE — Progress Notes (Signed)
Referring:  Alba Cory, MD [address] [place],  [place] [address]  PCP: Alba Cory, MD  Neurology was asked to evaluate Cynthia Woods, a 51 year old female for a chief complaint of headaches.  Our recommendations of care will be communicated by shared medical record.    CC:  headaches  History provided from self  HPI:  Medical co-morbidities: hypercholesterolemia  The patient presents for evaluation of headaches which began in childhood, but have worsened over the past 2 months. Currently she has headaches every other day. They are associated with photophobia, phonophobia, and nausea. Sometimes it will be worse when she lies down. She will hear her heartbeat in her ears with her most severe headaches.   Takes BC powder as needed which resolves her headache in ~5-10 minutes. She tries to limit using this to a few days per week.  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 Notes she just saw her eye doctor last month for blurred vision, who noted a normal eye exam.  Headache History: Onset: childhood, worsened 2 months ago Triggers: heat Aura: blurred vision Location: bitemporal, occipital Associated Symptoms:  Photophobia: yes  Phonophobia: yes  Nausea: yes Worse with activity?: yes Duration of headaches: several hours   Headache days per month: 15 Headache free days per month: 15  Current Treatment: Abortive BC powder  Preventative none  Prior Therapies                                 BC powder Tylenol ibuprofen    LABS: CBC    Component Value Date/Time   WBC 5.1 06/24/2022 1344   RBC 4.17 06/24/2022 1344   HGB 13.2 06/24/2022 1344   HGB 13.2 07/01/2020 1550   HCT 39.8 06/24/2022 1344   HCT 39.1 07/01/2020 1550   PLT 217 06/24/2022 1344   PLT 230 07/01/2020 1550   MCV 95.4 06/24/2022 1344   MCV 92 07/01/2020 1550   MCH 31.7 06/24/2022 1344   MCHC 33.2 06/24/2022 1344   RDW 12.6 06/24/2022 1344   RDW 12.3 07/01/2020  1550   LYMPHSABS 2,142 06/24/2022 1344   LYMPHSABS 1.9 07/01/2020 1550   EOSABS 71 06/24/2022 1344   EOSABS 0.1 07/01/2020 1550   BASOSABS 20 06/24/2022 1344   BASOSABS 0.0 07/01/2020 1550      Latest Ref Rng & Units 06/24/2022    1:44 PM 05/17/2021    4:21 PM 09/01/2020   11:19 AM  CMP  Glucose 65 - 99 mg/dL 85  87    BUN 7 - 25 mg/dL 11  13    Creatinine 3.29 - 0.99 mg/dL 5.18  8.41    Sodium 660 - 146 mmol/L 141  140    Potassium 3.5 - 5.3 mmol/L 4.4  4.6    Chloride 98 - 110 mmol/L 106  107    CO2 20 - 32 mmol/L 26  27    Calcium 8.6 - 10.2 mg/dL 9.5  9.0    Total Protein 6.1 - 8.1 g/dL 6.8  6.6  6.4   Total Bilirubin 0.2 - 1.2 mg/dL 0.3  0.3  0.5   AST 10 - 35 U/L 16  17  15    ALT 6 - 29 U/L 14  14  9       IMAGING:  MRI brain 07/08/22 with partially empty sella  Imaging independently reviewed on July 12, 2022   Current Outpatient  Medications on File Prior to Visit  Medication Sig Dispense Refill   Cholecalciferol (VITAMIN D) 50 MCG (2000 UT) CAPS Take 1 capsule (2,000 Units total) by mouth daily. 100 capsule 3   diazepam (VALIUM) 2 MG tablet Take 1 tablet (2 mg total) by mouth every 8 (eight) hours as needed for muscle spasms. 15 tablet 0   lidocaine (LIDODERM) 5 % Place 1 patch onto the skin daily. Remove & Discard patch within 12 hours or as directed by MD 30 patch 0   Multiple Vitamins-Minerals (WOMENS MULTIVITAMIN PO) Take by mouth.     naproxen (NAPROSYN) 500 MG tablet Take 1 tablet (500 mg total) by mouth 2 (two) times daily with a meal. 14 tablet 0   rosuvastatin (CRESTOR) 20 MG tablet Take 1 tablet (20 mg total) by mouth daily. 90 tablet 3   baclofen (LIORESAL) 10 MG tablet Take 1 tablet (10 mg total) by mouth 3 (three) times daily. (Patient not taking: Reported on 07/12/2022) 30 each 0   ondansetron (ZOFRAN-ODT) 4 MG disintegrating tablet Take 1 tablet (4 mg total) by mouth every 8 (eight) hours as needed for nausea or vomiting. (Patient not taking: Reported on  07/12/2022) 15 tablet 0   oxyCODONE-acetaminophen (PERCOCET/ROXICET) 5-325 MG tablet Take 1 tablet by mouth every 4 (four) hours as needed for severe pain. (Patient not taking: Reported on 07/12/2022) 15 tablet 0   No current facility-administered medications on file prior to visit.     Allergies: Allergies  Allergen Reactions   Shellfish Allergy Anaphylaxis   Sulfa Antibiotics Anaphylaxis   Latex Rash   Naproxen Diarrhea, Nausea Only and Other (See Comments)   Oxycodone Nausea Only and Nausea And Vomiting   Tramadol Nausea Only and Nausea And Vomiting    nausea    Family History: Migraine or other headaches in the family:  mother and grandmother Aneurysms in a first degree relative:  no Brain tumors in the family:  yes Other neurological illness in the family:   no  Past Medical History: Past Medical History:  Diagnosis Date   Hyperlipidemia     Past Surgical History Past Surgical History:  Procedure Laterality Date   BREAST CYST ASPIRATION     COLONOSCOPY WITH PROPOFOL N/A 11/27/2019   Procedure: COLONOSCOPY WITH PROPOFOL;  Surgeon: Earline Mayotte, MD;  Location: [facility] ENDOSCOPY;  Service: Endoscopy;  Laterality: N/A;   FOOT SURGERY  12/06/2007   GYNECOLOGIC CRYOSURGERY  12/06/1991   WISDOM TOOTH EXTRACTION  12/05/2009    Social History: Social History   Tobacco Use   Smoking status: Never   Smokeless tobacco: Never  Vaping Use   Vaping Use: Never used  Substance Use Topics   Alcohol use: Yes    Alcohol/week: 2.0 standard drinks of alcohol    Types: 2 Standard drinks or equivalent per week    Comment: last use 03/13/22 3x/mo   Drug use: No    ROS: Negative for fevers, chills. Positive for headaches. All other systems reviewed and negative unless stated otherwise in HPI.   Physical Exam:   Vital Signs: BP 112/85   Pulse 94   Ht 5\' 4"  (1.626 m)   Wt 162 lb 4 oz (73.6 kg)   LMP 06/18/2022 (Exact Date)   BMI 27.85 kg/m  GENERAL: well appearing,in no  acute distress,alert SKIN:  Color, texture, turgor normal. No rashes or lesions HEAD:  Normocephalic/atraumatic. CV:  RRR RESP: Normal respiratory effort MSK: no tenderness to palpation over occiput, neck, or shoulders  NEUROLOGICAL: Mental Status: Alert, oriented to person, place and time,Follows commands Cranial Nerves: PERRL, no papilledema visualized, visual fields intact to confrontation, extraocular movements intact, facial sensation intact, no facial droop or ptosis, hearing grossly intact, no dysarthria Motor: muscle strength 5/5 both upper and lower extremities Reflexes: 2+ throughout Sensation: intact to light touch all 4 extremities Coordination: Finger-to- nose-finger intact bilaterally Gait: normal-based   IMPRESSION: 51 year old female with a history of hypercholesterolemia who presents for evaluation of headaches and empty sella on MRI. Her optic discs look sharp on fundus exam today. Will have her send eye doctor notes over to confirm there was no papilledema seen on their exam. In the meantime will start Topamax for migraine prevention and Maxalt for rescue.  PLAN: -Patient will have notes from eye doctor sent over -Prevention: Start Topamax 25 mg QHS, increase by 25 mg weekly up to 100 mg QHS -Rescue: Start Maxalt 10 mg PRN   I spent a total of 25 minutes chart reviewing and counseling the patient. Headache education was done. Discussed treatment options including preventive and acute medications. Discussed medication side effects, adverse reactions and drug interactions. Written educational materials and patient instructions outlining all of the above were given.  Follow-up: 3 months   Ocie Doyne, MD 07/12/2022   2:40 PM

## 2022-07-13 DIAGNOSIS — M62830 Muscle spasm of back: Secondary | ICD-10-CM | POA: Diagnosis not present

## 2022-07-13 DIAGNOSIS — M545 Low back pain, unspecified: Secondary | ICD-10-CM | POA: Diagnosis not present

## 2022-07-15 ENCOUNTER — Encounter: Payer: Self-pay | Admitting: Psychiatry

## 2022-07-18 ENCOUNTER — Telehealth: Payer: Self-pay | Admitting: Family Medicine

## 2022-07-18 ENCOUNTER — Other Ambulatory Visit: Payer: Self-pay | Admitting: Family Medicine

## 2022-07-18 DIAGNOSIS — G44229 Chronic tension-type headache, not intractable: Secondary | ICD-10-CM

## 2022-07-18 DIAGNOSIS — G4452 New daily persistent headache (NDPH): Secondary | ICD-10-CM

## 2022-07-18 NOTE — Telephone Encounter (Unsigned)
Copied from CRM [id number]. Topic: General - Inquiry >> Jul 18, 2022 11:20 AM Pincus Sanes wrote: Reason for CRM: Pt was given a referral to [facility] and went to the appt and felt like her concerns were not heard and was just offered pills, pt has done research and wants a referral to [facility] in [place]. Ph # [phone] and fax is [phone] States that the DR is Dr Val Eagle.  Questions call pt at [phone]

## 2022-07-19 NOTE — Telephone Encounter (Signed)
I haven't received them yet. Could we have the office re-send them?

## 2022-07-21 DIAGNOSIS — H11441 Conjunctival cysts, right eye: Secondary | ICD-10-CM | POA: Diagnosis not present

## 2022-07-28 ENCOUNTER — Telehealth: Payer: Self-pay

## 2022-07-28 NOTE — Telephone Encounter (Signed)
[facility] for Rizatriptan Benzoate 10MG  Tab for signed and faxed back to . Confirmation received

## 2022-07-28 NOTE — Telephone Encounter (Signed)
Fax from [facility] stating pt doe snot have active eligibility. Contact [facility] pharmacy, they stated PA was not needed. She picked up Rx 07/12/22.

## 2022-07-28 NOTE — Telephone Encounter (Signed)
Notes received, placed on MD desk for review  

## 2022-08-01 ENCOUNTER — Ambulatory Visit: Payer: Medicaid Other | Admitting: Psychiatry

## 2022-08-01 NOTE — Patient Instructions (Signed)
Preventive Care 40-49 Years Old, Female Preventive care refers to lifestyle choices and visits with your health care provider that can promote health and wellness. Preventive care visits are also called wellness exams. What can I expect for my preventive care visit? Counseling Your health care provider may ask you questions about your: Medical history, including: Past medical problems. Family medical history. Pregnancy history. Current health, including: Menstrual cycle. Method of birth control. Emotional well-being. Home life and relationship well-being. Sexual activity and sexual health. Lifestyle, including: Alcohol, nicotine or tobacco, and drug use. Access to firearms. Diet, exercise, and sleep habits. Work and work environment. Sunscreen use. Safety issues such as seatbelt and bike helmet use. Physical exam Your health care provider will check your: Height and weight. These may be used to calculate your BMI (body mass index). BMI is a measurement that tells if you are at a healthy weight. Waist circumference. This measures the distance around your waistline. This measurement also tells if you are at a healthy weight and may help predict your risk of certain diseases, such as type 2 diabetes and high blood pressure. Heart rate and blood pressure. Body temperature. Skin for abnormal spots. What immunizations do I need?  Vaccines are usually given at various ages, according to a schedule. Your health care provider will recommend vaccines for you based on your age, medical history, and lifestyle or other factors, such as travel or where you work. What tests do I need? Screening Your health care provider may recommend screening tests for certain conditions. This may include: Lipid and cholesterol levels. Diabetes screening. This is done by checking your blood sugar (glucose) after you have not eaten for a while (fasting). Pelvic exam and Pap test. Hepatitis B test. Hepatitis C  test. HIV (human immunodeficiency virus) test. STI (sexually transmitted infection) testing, if you are at risk. Lung cancer screening. Colorectal cancer screening. Mammogram. Talk with your health care provider about when you should start having regular mammograms. This may depend on whether you have a family history of breast cancer. BRCA-related cancer screening. This may be done if you have a family history of breast, ovarian, tubal, or peritoneal cancers. Bone density scan. This is done to screen for osteoporosis. Talk with your health care provider about your test results, treatment options, and if necessary, the need for more tests. Follow these instructions at home: Eating and drinking  Eat a diet that includes fresh fruits and vegetables, whole grains, lean protein, and low-fat dairy products. Take vitamin and mineral supplements as recommended by your health care provider. Do not drink alcohol if: Your health care provider tells you not to drink. You are pregnant, may be pregnant, or are planning to become pregnant. If you drink alcohol: Limit how much you have to 0-1 drink a day. Know how much alcohol is in your drink. In the [place] one drink equals one 12 oz bottle of beer (355 mL), one 5 oz glass of wine (148 mL), or one 1 oz glass of hard liquor (44 mL). Lifestyle Brush your teeth every morning and night with fluoride toothpaste. Floss one time each day. Exercise for at least 30 minutes 5 or more days each week. Do not use any products that contain nicotine or tobacco. These products include cigarettes, chewing tobacco, and vaping devices, such as e-cigarettes. If you need help quitting, ask your health care provider. Do not use drugs. If you are sexually active, practice safe sex. Use a condom or other form of protection to   prevent STIs. If you do not wish to become pregnant, use a form of birth control. If you plan to become pregnant, see your health care provider for a  prepregnancy visit. Take aspirin only as told by your health care provider. Make sure that you understand how much to take and what form to take. Work with your health care provider to find out whether it is safe and beneficial for you to take aspirin daily. Find healthy ways to manage stress, such as: Meditation, yoga, or listening to music. Journaling. Talking to a trusted person. Spending time with friends and family. Minimize exposure to UV radiation to reduce your risk of skin cancer. Safety Always wear your seat belt while driving or riding in a vehicle. Do not drive: If you have been drinking alcohol. Do not ride with someone who has been drinking. When you are tired or distracted. While texting. If you have been using any mind-altering substances or drugs. Wear a helmet and other protective equipment during sports activities. If you have firearms in your house, make sure you follow all gun safety procedures. Seek help if you have been physically or sexually abused. What's next? Visit your health care provider once a year for an annual wellness visit. Ask your health care provider how often you should have your eyes and teeth checked. Stay up to date on all vaccines. This information is not intended to replace advice given to you by your health care provider. Make sure you discuss any questions you have with your health care provider. Document Revised: 05/19/2021 Document Reviewed: 05/19/2021 Elsevier Patient Education  [place].

## 2022-08-01 NOTE — Progress Notes (Unsigned)
Name: Cynthia Woods   MRN: [id number]    DOB: 12-Nov-1971   Date:08/02/2022       Progress Note  Subjective  Chief Complaint  Annual Exam  HPI  Patient presents for annual CPE.  Diet: avoids fried food, balanced  Exercise: continue regular physical activity   Last Eye Exam: yearly  Last Dental Exam: recently   [place] [facility]/12/[facility] in [facility]  AUDIT-C Score 0      Depression: Phq 9 is  negative    08/02/2022    2:42 PM 08/02/2022    2:27 PM 06/24/2022    1:04 PM 02/09/2022    3:44 PM 09/14/2021   11:26 AM  Depression screen PHQ 2/9  Decreased Interest 0 0 1 0 0  Down, Depressed, Hopeless 0 0 1 0 1  PHQ - 2 Score 0 0 2 0 1  Altered sleeping 2 0 1 0 0  Tired, decreased energy 2 0 0 0 1  Change in appetite 2 0 3 0 0  Feeling bad or failure about yourself  0 0 1 0 3  Trouble concentrating 0 0 0 0 0  Moving slowly or fidgety/restless 0 0 0 0 0  Suicidal thoughts 0 0 0 0 0  PHQ-9 Score 6 0 7 0 5  Difficult doing work/chores Not difficult at all Not difficult at all Not difficult at all Not difficult at all Somewhat difficult   Hypertension: BP Readings from Last 3 Encounters:  08/02/22 116/82  07/12/22 112/85  07/02/22 98/72   Obesity: Wt Readings from Last 3 Encounters:  08/02/22 164 lb 1.6 oz (74.4 kg)  07/12/22 162 lb 4 oz (73.6 kg)  07/02/22 161 lb (73 kg)   BMI Readings from Last 3 Encounters:  08/02/22 28.17 kg/m  07/12/22 27.85 kg/m  07/02/22 27.64 kg/m     Vaccines:   HPV: N/A Tdap: up to date Shingrix: N/A Pneumonia: N/A Flu: 09/30/21 COVID-19: up to date   Hep C Screening: 09/28/17 STD testing and prevention (HIV/chl/gon/syphilis): 09/03/18 Intimate partner violence: negative screen  Sexual History :no pain, same partner  Menstrual History/LMP/Abnormal Bleeding: LMP 07/17/2022, regular cycles , only lasts 3 days , she was trying to get pregnant last year, not using protection  Discussed  importance of follow up if any post-menopausal bleeding: not applicable  Incontinence Symptoms: negative for symptoms   Breast cancer:  - Last Mammogram: 12/07/21 - BRCA gene screening: colon cancer paternal grandfather, lung cancer maternal grandmother   Osteoporosis Prevention : Discussed high calcium and vitamin D supplementation, weight bearing exercises Bone density: N/A   Cervical cancer screening: 05/08/20, she states she is going to gyn   Skin cancer: Discussed monitoring for atypical lesions  Colorectal cancer: 11/27/19   Lung cancer:  Low Dose CT Chest recommended if Age 67-80 years, 20 pack-year currently smoking OR have quit w/in 15years. Patient does not qualify for screen   ECG: 08/03/20  Advanced Care Planning: A voluntary discussion about advance care planning including the explanation and discussion of advance directives.  Discussed health care proxy and Living will, and the patient was able to identify a health care proxy as son Merrilyn Puma .  Patient does not have a living will and power of attorney of health care   Lipids: Lab Results  Component Value Date   CHOL 196 06/24/2022   CHOL 246 (H) 05/17/2021   CHOL 195 09/01/2020   Lab Results  Component Value Date  HDL 70 06/24/2022   HDL 61 05/17/2021   HDL 79 09/01/2020   Lab Results  Component Value Date   LDLCALC 112 (H) 06/24/2022   LDLCALC 154 (H) 05/17/2021   LDLCALC 96 09/01/2020   Lab Results  Component Value Date   TRIG 62 06/24/2022   TRIG 168 (H) 05/17/2021   TRIG 104 09/01/2020   Lab Results  Component Value Date   CHOLHDL 2.8 06/24/2022   CHOLHDL 4.0 05/17/2021   CHOLHDL 2.5 09/01/2020   No results found for: "LDLDIRECT"  Glucose: Glucose  Date Value Ref Range Status  07/01/2020 106 (H) 65 - 99 mg/dL Final  09/28/2017 117 (H) 65 - 99 mg/dL Final   Glucose, Bld  Date Value Ref Range Status  06/24/2022 85 65 - 99 mg/dL Final    Comment:    .            Fasting reference  interval .   05/17/2021 87 65 - 99 mg/dL Final    Comment:    .            Fasting reference interval .     Patient Active Problem List   Diagnosis Date Noted   Vitamin D deficiency 06/24/2022   Back spasm 06/24/2022   Abnormal cervical Papanicolaou smear 06/22/2022   Abnormal Pap smear of cervix 08/03/21 LGSIL HPV+ 03/14/2022   Pure hypercholesterolemia 05/17/2021   Chronic tension-type headache, not intractable 07/03/2018   Migraine without aura and without status migrainosus, not intractable 07/03/2018   Cervical radiculopathy 07/20/2017   Benign breast cyst in female 09/09/2014    Past Surgical History:  Procedure Laterality Date   BREAST CYST ASPIRATION     COLONOSCOPY WITH PROPOFOL N/A 11/27/2019   Procedure: COLONOSCOPY WITH PROPOFOL;  Surgeon: Robert Bellow, MD;  Location: [facility];  Service: Endoscopy;  Laterality: N/A;   FOOT SURGERY  12/06/2007   GYNECOLOGIC CRYOSURGERY  12/06/1991   WISDOM TOOTH EXTRACTION  12/05/2009    Family History  Problem Relation Age of Onset   Other Mother        brain tumor   Other Maternal Aunt        brain tumor   Other Maternal Grandmother        brain tumor   Cancer Maternal Grandmother    Diabetes Maternal Grandfather    Breast cancer Paternal Grandmother    Hypertension Paternal Grandmother    Cancer Paternal Grandfather        colon    Social History   Socioeconomic History   Marital status: Single    Spouse name: Not on file   Number of children: 3   Years of education: Not on file   Highest education level: Not on file  Occupational History    Comment: part   Tobacco Use   Smoking status: Never   Smokeless tobacco: Never  Vaping Use   Vaping Use: Never used  Substance and Sexual Activity   Alcohol use: Yes    Alcohol/week: 2.0 standard drinks of alcohol    Types: 2 Standard drinks or equivalent per week    Comment: last use 03/13/22 3x/mo   Drug use: No   Sexual activity: Yes    Partners:  Male    Birth control/protection: None  Other Topics Concern   Not on file  Social History Narrative   She used to work at [employer] but had to quit after she settled her [employer]'s comp claim in August 2019  Currently working at [employer] job also has a [employer] part time job    She has 3 children at home   Social Determinants of Health   Financial Resource Strain: Medium Risk (08/02/2022)   Overall Financial Resource Strain (CARDIA)    Difficulty of Paying Living Expenses: Somewhat hard  Food Insecurity: Food Insecurity Present (08/02/2022)   Hunger Vital Sign    Worried About [place] in the Last Year: Sometimes true    Ran Out of Food in the Last Year: Sometimes true  Transportation Needs: No Transportation Needs (08/02/2022)   PRAPARE - Hydrologist (Medical): No    Lack of Transportation (Non-Medical): No  Physical Activity: Sufficiently Active (08/02/2022)   Exercise Vital Sign    Days of Exercise per Week: 6 days    Minutes of Exercise per Session: 30 min  Stress: Stress Concern Present (08/02/2022)   [place]    Feeling of Stress : To some extent  Social Connections: Moderately Integrated (08/02/2022)   Social Connection and Isolation Panel [NHANES]    Frequency of Communication with Friends and Family: Three times a week    Frequency of Social Gatherings with Friends and Family: Twice a week    Attends Religious Services: More than 4 times per year    Active Member of [employer] or Organizations: Yes    Attends Music therapist: More than 4 times per year    Marital Status: Never married  Intimate Partner Violence: Not At Risk (08/02/2022)   Humiliation, Afraid, Rape, and Kick questionnaire    Fear of Current or Ex-Partner: No    Emotionally Abused: No    Physically Abused: No    Sexually Abused: No     Current Outpatient Medications:    Cholecalciferol  (VITAMIN D) 50 MCG (2000 UT) CAPS, Take 1 capsule (2,000 Units total) by mouth daily., Disp: 100 capsule, Rfl: 3   diazepam (VALIUM) 2 MG tablet, Take 1 tablet (2 mg total) by mouth every 8 (eight) hours as needed for muscle spasms., Disp: 15 tablet, Rfl: 0   lidocaine (LIDODERM) 5 %, Place 1 patch onto the skin daily. Remove & Discard patch within 12 hours or as directed by MD, Disp: 30 patch, Rfl: 0   Multiple Vitamins-Minerals (WOMENS MULTIVITAMIN PO), Take by mouth., Disp: , Rfl:    naproxen (NAPROSYN) 500 MG tablet, Take 1 tablet (500 mg total) by mouth 2 (two) times daily with a meal., Disp: 14 tablet, Rfl: 0   rizatriptan (MAXALT) 10 MG tablet, Take 1 tablet (10 mg total) by mouth as needed for migraine. May repeat in 2 hours if needed, Disp: 10 tablet, Rfl: 6   rosuvastatin (CRESTOR) 20 MG tablet, Take 1 tablet (20 mg total) by mouth daily., Disp: 90 tablet, Rfl: 3   topiramate (TOPAMAX) 25 MG tablet, Start Topamax for headache prevention. Take 25 mg (1 pill) at bedtime for one week, then increase to 50 mg (2 pills) at bedtime for one week, then take 75 mg (3 pills) at bedtime for one week, then take 100 mg (4 pills) at bedtime, Disp: 120 tablet, Rfl: 6  Allergies  Allergen Reactions   Shellfish Allergy Anaphylaxis   Sulfa Antibiotics Anaphylaxis   Latex Rash   Naproxen Diarrhea, Nausea Only and Other (See Comments)   Oxycodone Nausea Only and Nausea And Vomiting   Tramadol Nausea Only and Nausea And Vomiting  nausea     ROS  Constitutional: Negative for fever or weight change.  Respiratory: Negative for cough and shortness of breath.   Cardiovascular: Negative for chest pain or palpitations.  Gastrointestinal: Negative for abdominal pain, no bowel changes.  Musculoskeletal: Negative for gait problem or joint swelling.  Skin: Negative for rash.  Neurological: Negative for dizziness , positive for headache and neck pain .  No other specific complaints in a complete review of  systems (except as listed in HPI above).   Objective  Vitals:   08/02/22 1451  BP: 116/82  Pulse: 98  Resp: 14  Temp: 98.2 F (36.8 C)  TempSrc: Oral  SpO2: 99%  Weight: 164 lb 1.6 oz (74.4 kg)  Height: 5' 4"  (1.626 m)    Body mass index is 28.17 kg/m.  Physical Exam  Constitutional: Patient appears well-developed and well-nourished. No distress.  HENT: Head: Normocephalic and atraumatic. Ears: B TMs ok, no erythema or effusion; Nose: Nose normal. Mouth/Throat: Oropharynx is clear and moist. No oropharyngeal exudate.  Eyes: Conjunctivae and EOM are normal. Pupils are equal, round, and reactive to light. No scleral icterus.  Neck: Normal range of motion. Neck supple. No JVD present. No thyromegaly present.  Cardiovascular: Normal rate, regular rhythm and normal heart sounds.  No murmur heard. No BLE edema. Pulmonary/Chest: Effort normal and breath sounds normal. No respiratory distress. Abdominal: Soft. Bowel sounds are normal, no distension. There is no tenderness. no masses Breast: no lumps or masses, no nipple discharge or rashes FEMALE GENITALIA:  Not done  RECTAL: not done  Musculoskeletal: Normal range of motion, no joint effusions. No gross deformities Neurological: he is alert and oriented to person, place, and time. No cranial nerve deficit. Coordination, balance, strength, speech and gait are normal.  Skin: Skin is warm and dry. No rash noted. No erythema.  Psychiatric: Patient has a normal mood and affect. behavior is normal. Judgment and thought content normal.   Recent Results (from the past 2160 hour(s))  Lipid panel     Status: Abnormal   Collection Time: 06/24/22  1:44 PM  Result Value Ref Range   Cholesterol 196 <200 mg/dL   HDL 70 > OR = 50 mg/dL   Triglycerides 62 <150 mg/dL   LDL Cholesterol (Calc) 112 (H) mg/dL (calc)    Comment: Reference range: <100 . Desirable range <100 mg/dL for primary prevention;   <70 mg/dL for patients with CHD or diabetic  patients  with > or = 2 CHD risk factors. Marland Kitchen LDL-C is now calculated using the Martin-Hopkins  calculation, which is a validated novel method providing  better accuracy than the Friedewald equation in the  estimation of LDL-C.  Cresenciano Genre et al. Annamaria Helling. 8502;774(12): 2061-2068  (http://education.[facility].com/faq/FAQ164)    Total CHOL/HDL Ratio 2.8 <5.0 (calc)   Non-HDL Cholesterol (Calc) 126 <130 mg/dL (calc)    Comment: For patients with diabetes plus 1 major ASCVD risk  factor, treating to a non-HDL-C goal of <100 mg/dL  (LDL-C of <70 mg/dL) is considered a therapeutic  option.   CBC with Differential/Platelet     Status: None   Collection Time: 06/24/22  1:44 PM  Result Value Ref Range   WBC 5.1 3.8 - 10.8 Thousand/uL   RBC 4.17 3.80 - 5.10 Million/uL   Hemoglobin 13.2 11.7 - 15.5 g/dL   HCT 39.8 35.0 - 45.0 %   MCV 95.4 80.0 - 100.0 fL   MCH 31.7 27.0 - 33.0 pg   MCHC 33.2 32.0 -  36.0 g/dL   RDW 12.6 11.0 - 15.0 %   Platelets 217 140 - 400 Thousand/uL   MPV 11.1 7.5 - 12.5 fL   Neutro Abs 2,509 1,500 - 7,800 cells/uL   Lymphs Abs 2,142 850 - 3,900 cells/uL   Absolute Monocytes 357 200 - 950 cells/uL   Eosinophils Absolute 71 15 - 500 cells/uL   Basophils Absolute 20 0 - 200 cells/uL   Neutrophils Relative % 49.2 %   Total Lymphocyte 42.0 %   Monocytes Relative 7.0 %   Eosinophils Relative 1.4 %   Basophils Relative 0.4 %  COMPLETE METABOLIC PANEL WITH GFR     Status: None   Collection Time: 06/24/22  1:44 PM  Result Value Ref Range   Glucose, Bld 85 65 - 99 mg/dL    Comment: .            Fasting reference interval .    BUN 11 7 - 25 mg/dL   Creat 0.74 0.50 - 0.99 mg/dL   eGFR 100 > OR = 60 mL/min/1.35m    Comment: The eGFR is based on the CKD-EPI 2021 equation. To calculate  the new eGFR from a previous Creatinine or Cystatin C result, go to https://www.kidney.org/professionals/ kdoqi/gfr%5Fcalculator    BUN/Creatinine Ratio NOT APPLICABLE 6 - 22  (calc)   Sodium 141 135 - 146 mmol/L   Potassium 4.4 3.5 - 5.3 mmol/L   Chloride 106 98 - 110 mmol/L   CO2 26 20 - 32 mmol/L   Calcium 9.5 8.6 - 10.2 mg/dL   Total Protein 6.8 6.1 - 8.1 g/dL   Albumin 4.4 3.6 - 5.1 g/dL   Globulin 2.4 1.9 - 3.7 g/dL (calc)   AG Ratio 1.8 1.0 - 2.5 (calc)   Total Bilirubin 0.3 0.2 - 1.2 mg/dL   Alkaline phosphatase (APISO) 52 31 - 125 U/L   AST 16 10 - 35 U/L   ALT 14 6 - 29 U/L  Hemoglobin A1c     Status: None   Collection Time: 06/24/22  1:44 PM  Result Value Ref Range   Hgb A1c MFr Bld 5.2 <5.7 % of total Hgb    Comment: For the purpose of screening for the presence of diabetes: . <5.7%       Consistent with the absence of diabetes 5.7-6.4%    Consistent with increased risk for diabetes             (prediabetes) > or =6.5%  Consistent with diabetes . This assay result is consistent with a decreased risk of diabetes. . Currently, no consensus exists regarding use of hemoglobin A1c for diagnosis of diabetes in children. . According to American Diabetes Association (ADA) guidelines, hemoglobin A1c <7.0% represents optimal control in non-pregnant diabetic patients. Different metrics may apply to specific patient populations.  Standards of Medical Care in Diabetes(ADA). .    Mean Plasma Glucose 103 mg/dL   eAG (mmol/L) 5.7 mmol/L  TSH     Status: None   Collection Time: 06/24/22  1:44 PM  Result Value Ref Range   TSH 0.84 mIU/L    Comment:           Reference Range .           > or = 20 Years  0.40-4.50 .                Pregnancy Ranges           First trimester    0.26-2.66  Second trimester   0.55-2.73           Third trimester    0.43-2.91   VITAMIN D 25 Hydroxy (Vit-D Deficiency, Fractures)     Status: Abnormal   Collection Time: 06/24/22  1:44 PM  Result Value Ref Range   Vit D, 25-Hydroxy 29 (L) 30 - 100 ng/mL    Comment: Vitamin D Status         25-OH Vitamin D: . Deficiency:                    <20  ng/mL Insufficiency:             20 - 29 ng/mL Optimal:                 > or = 30 ng/mL . For 25-OH Vitamin D testing on patients on  D2-supplementation and patients for whom quantitation  of D2 and D3 fractions is required, the [facility]ure[facility](TM) 25-OH VIT D, (D2,D3), LC/MS/MS is recommended: order  code [id number] (patients >21yr). . See Note 1 . Note 1 . For additional information, please refer to  http://education.[facility].com/faq/FAQ199  (This link is being provided for informational/ educational purposes only.)      Fall Risk:    08/02/2022    2:41 PM 08/02/2022    2:27 PM 06/24/2022    1:04 PM 02/09/2022    3:44 PM 09/14/2021   11:26 AM  Fall Risk   Falls in the past year? 0 0 0 0 0  Number falls in past yr:  0  0 0  Injury with Fall?  0  0 0  Risk for fall due to : No Fall Risks No Fall Risks No Fall Risks No Fall Risks No Fall Risks  Follow up Falls prevention discussed;Education provided;Falls evaluation completed Falls prevention discussed;Education provided Falls prevention discussed Education provided Falls prevention discussed     Functional Status Survey: Is the patient deaf or have difficulty hearing?: No Does the patient have difficulty seeing, even when wearing glasses/contacts?: Yes Does the patient have difficulty concentrating, remembering, or making decisions?: No Does the patient have difficulty walking or climbing stairs?: No Does the patient have difficulty dressing or bathing?: No Does the patient have difficulty doing errands alone such as visiting a doctor's office or shopping?: No   Assessment & Plan  1. Well adult exam   2. Encounter for screening mammogram for malignant neoplasm of breast  - MM 3D SCREEN BREAST BILATERAL; Future  3. Empty sella (HCC)  - Prolactin    -USPSTF grade A and B recommendations reviewed with patient; age-appropriate recommendations, preventive care, screening tests, etc discussed and encouraged; healthy  living encouraged; see AVS for patient education given to patient -Discussed importance of 150 minutes of physical activity weekly, eat two servings of fish weekly, eat one serving of tree nuts ( cashews, pistachios, pecans, almonds..Marland Kitchen every other day, eat 6 servings of fruit/vegetables daily and drink plenty of water and avoid sweet beverages.   -Reviewed Health Maintenance: Yes.

## 2022-08-02 ENCOUNTER — Encounter: Payer: Self-pay | Admitting: Family Medicine

## 2022-08-02 ENCOUNTER — Ambulatory Visit (INDEPENDENT_AMBULATORY_CARE_PROVIDER_SITE_OTHER): Payer: BC Managed Care – PPO | Admitting: Family Medicine

## 2022-08-02 VITALS — BP 116/82 | HR 98 | Temp 98.2°F | Resp 14 | Ht 64.0 in | Wt 164.1 lb

## 2022-08-02 DIAGNOSIS — E236 Other disorders of pituitary gland: Secondary | ICD-10-CM

## 2022-08-02 DIAGNOSIS — Z0000 Encounter for general adult medical examination without abnormal findings: Secondary | ICD-10-CM

## 2022-08-02 DIAGNOSIS — Z1231 Encounter for screening mammogram for malignant neoplasm of breast: Secondary | ICD-10-CM | POA: Diagnosis not present

## 2022-08-03 LAB — PROLACTIN: Prolactin: 4 ng/mL

## 2022-08-03 NOTE — Telephone Encounter (Unsigned)
Copied from CRM [id number]. Topic: General - Other >> Aug 03, 2022  8:27 AM Clide Dales wrote: Patient called to see if pcp can put a note in her MyChart excusing her from work yesterday for her appointment. Please advise.

## 2022-08-05 DIAGNOSIS — Z419 Encounter for procedure for purposes other than remedying health state, unspecified: Secondary | ICD-10-CM | POA: Diagnosis not present

## 2022-08-09 DIAGNOSIS — N6311 Unspecified lump in the right breast, upper outer quadrant: Secondary | ICD-10-CM | POA: Diagnosis not present

## 2022-08-09 DIAGNOSIS — N6002 Solitary cyst of left breast: Secondary | ICD-10-CM | POA: Diagnosis not present

## 2022-08-29 ENCOUNTER — Encounter: Payer: Self-pay | Admitting: Family Medicine

## 2022-09-02 DIAGNOSIS — Z01419 Encounter for gynecological examination (general) (routine) without abnormal findings: Secondary | ICD-10-CM | POA: Diagnosis not present

## 2022-09-02 DIAGNOSIS — R87612 Low grade squamous intraepithelial lesion on cytologic smear of cervix (LGSIL): Secondary | ICD-10-CM | POA: Diagnosis not present

## 2022-09-02 DIAGNOSIS — N760 Acute vaginitis: Secondary | ICD-10-CM | POA: Diagnosis not present

## 2022-09-02 DIAGNOSIS — Z6828 Body mass index (BMI) 28.0-28.9, adult: Secondary | ICD-10-CM | POA: Diagnosis not present

## 2022-09-04 DIAGNOSIS — Z419 Encounter for procedure for purposes other than remedying health state, unspecified: Secondary | ICD-10-CM | POA: Diagnosis not present

## 2022-09-08 DIAGNOSIS — N6002 Solitary cyst of left breast: Secondary | ICD-10-CM | POA: Diagnosis not present

## 2022-09-27 DIAGNOSIS — N6311 Unspecified lump in the right breast, upper outer quadrant: Secondary | ICD-10-CM | POA: Diagnosis not present

## 2022-09-27 DIAGNOSIS — N6002 Solitary cyst of left breast: Secondary | ICD-10-CM | POA: Diagnosis not present

## 2022-11-02 ENCOUNTER — Ambulatory Visit: Payer: BC Managed Care – PPO | Admitting: Adult Health

## 2022-11-02 ENCOUNTER — Encounter: Payer: Self-pay | Admitting: Adult Health

## 2022-11-02 NOTE — Progress Notes (Deleted)
Referring:  Cynthia Cory, MD [address] [place],  [place] [address]  PCP: Cynthia Cory, MD   CC:  headaches No chief complaint on file.    HPI:   Consult visit 07/12/2022 Dr. Delena Bali: Medical co-morbidities: hypercholesterolemia  The patient presents for evaluation of headaches which began in childhood, but have worsened over the past 2 months. Currently she has headaches every other day. They are associated with photophobia, phonophobia, and nausea. Sometimes it will be worse when she lies down. She will hear her heartbeat in her ears with her most severe headaches.   Takes BC powder as needed which resolves her headache in ~5-10 minutes. She tries to limit using this to a few days per week.  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 Notes she just saw her eye doctor last month for blurred vision, who noted a normal eye exam.   Update 11/02/2022 JM: Patient returns for 76-month headache follow-up.  She was started on topiramate at prior visit currently taking ***, reports headaches ***.   Dr. Delena Bali reviewed ophthalmology OV notes which did not show evidence of papilledema therefore empty sella on MRI likely incidental finding.    Headache History: Onset: childhood, worsened 2 months ago Triggers: heat Aura: blurred vision Location: bitemporal, occipital Associated Symptoms:  Photophobia: yes  Phonophobia: yes  Nausea: yes Worse with activity?: yes Duration of headaches: several hours   Headache days per month: 15 Headache free days per month: 15  Current Treatment: Abortive Rizatriptan  Preventative Topiramate  Prior Therapies                                 BC powder Tylenol ibuprofen    LABS: CBC    Component Value Date/Time   WBC 5.1 06/24/2022 1344   RBC 4.17 06/24/2022 1344   HGB 13.2 06/24/2022 1344   HGB 13.2 07/01/2020 1550   HCT 39.8 06/24/2022 1344   HCT 39.1 07/01/2020 1550   PLT 217 06/24/2022 1344   PLT 230  07/01/2020 1550   MCV 95.4 06/24/2022 1344   MCV 92 07/01/2020 1550   MCH 31.7 06/24/2022 1344   MCHC 33.2 06/24/2022 1344   RDW 12.6 06/24/2022 1344   RDW 12.3 07/01/2020 1550   LYMPHSABS 2,142 06/24/2022 1344   LYMPHSABS 1.9 07/01/2020 1550   EOSABS 71 06/24/2022 1344   EOSABS 0.1 07/01/2020 1550   BASOSABS 20 06/24/2022 1344   BASOSABS 0.0 07/01/2020 1550      Latest Ref Rng & Units 06/24/2022    1:44 PM 05/17/2021    4:21 PM 09/01/2020   11:19 AM  CMP  Glucose 65 - 99 mg/dL 85  87    BUN 7 - 25 mg/dL 11  13    Creatinine 1.76 - 0.99 mg/dL 1.60  7.37    Sodium 106 - 146 mmol/L 141  140    Potassium 3.5 - 5.3 mmol/L 4.4  4.6    Chloride 98 - 110 mmol/L 106  107    CO2 20 - 32 mmol/L 26  27    Calcium 8.6 - 10.2 mg/dL 9.5  9.0    Total Protein 6.1 - 8.1 g/dL 6.8  6.6  6.4   Total Bilirubin 0.2 - 1.2 mg/dL 0.3  0.3  0.5   AST 10 - 35 U/L 16  17  15    ALT 6 - 29 U/L 14  14  9  IMAGING:  MRI brain 07/08/22 with partially empty sella  Imaging independently reviewed on November 02, 2022   Current Outpatient Medications on File Prior to Visit  Medication Sig Dispense Refill   Cholecalciferol (VITAMIN D) 50 MCG (2000 UT) CAPS Take 1 capsule (2,000 Units total) by mouth daily. 100 capsule 3   diazepam (VALIUM) 2 MG tablet Take 1 tablet (2 mg total) by mouth every 8 (eight) hours as needed for muscle spasms. 15 tablet 0   lidocaine (LIDODERM) 5 % Place 1 patch onto the skin daily. Remove & Discard patch within 12 hours or as directed by MD 30 patch 0   Multiple Vitamins-Minerals (WOMENS MULTIVITAMIN PO) Take by mouth.     naproxen (NAPROSYN) 500 MG tablet Take 1 tablet (500 mg total) by mouth 2 (two) times daily with a meal. 14 tablet 0   rizatriptan (MAXALT) 10 MG tablet Take 1 tablet (10 mg total) by mouth as needed for migraine. May repeat in 2 hours if needed 10 tablet 6   rosuvastatin (CRESTOR) 20 MG tablet Take 1 tablet (20 mg total) by mouth daily. 90 tablet 3    topiramate (TOPAMAX) 25 MG tablet Start Topamax for headache prevention. Take 25 mg (1 pill) at bedtime for one week, then increase to 50 mg (2 pills) at bedtime for one week, then take 75 mg (3 pills) at bedtime for one week, then take 100 mg (4 pills) at bedtime 120 tablet 6   No current facility-administered medications on file prior to visit.     Allergies: Allergies  Allergen Reactions   Shellfish Allergy Anaphylaxis   Sulfa Antibiotics Anaphylaxis   Latex Rash   Naproxen Diarrhea, Nausea Only and Other (See Comments)   Oxycodone Nausea Only and Nausea And Vomiting   Tramadol Nausea Only and Nausea And Vomiting    nausea    Family History: Migraine or other headaches in the family:  mother and grandmother Aneurysms in a first degree relative:  no Brain tumors in the family:  yes Other neurological illness in the family:   no  Past Medical History: Past Medical History:  Diagnosis Date   Hyperlipidemia     Past Surgical History Past Surgical History:  Procedure Laterality Date   BREAST CYST ASPIRATION     COLONOSCOPY WITH PROPOFOL N/A 11/27/2019   Procedure: COLONOSCOPY WITH PROPOFOL;  Surgeon: Earline Mayotte, MD;  Location: [facility] ENDOSCOPY;  Service: Endoscopy;  Laterality: N/A;   FOOT SURGERY  12/06/2007   GYNECOLOGIC CRYOSURGERY  12/06/1991   WISDOM TOOTH EXTRACTION  12/05/2009    Social History: Social History   Tobacco Use   Smoking status: Never   Smokeless tobacco: Never  Vaping Use   Vaping Use: Never used  Substance Use Topics   Alcohol use: Yes    Alcohol/week: 2.0 standard drinks of alcohol    Types: 2 Standard drinks or equivalent per week    Comment: last use 03/13/22 3x/mo   Drug use: No    ROS: Negative for fevers, chills. Positive for headaches. All other systems reviewed and negative unless stated otherwise in HPI.   Physical Exam:   Vital Signs: There were no vitals taken for this visit. GENERAL: well appearing,in no acute  distress,alert SKIN:  Color, texture, turgor normal. No rashes or lesions HEAD:  Normocephalic/atraumatic. CV:  RRR RESP: Normal respiratory effort MSK: no tenderness to palpation over occiput, neck, or shoulders  NEUROLOGICAL: Mental Status: Alert, oriented to person, place and time,Follows commands Cranial Nerves:  PERRL, no papilledema visualized, visual fields intact to confrontation, extraocular movements intact, facial sensation intact, no facial droop or ptosis, hearing grossly intact, no dysarthria Motor: muscle strength 5/5 both upper and lower extremities Reflexes: 2+ throughout Sensation: intact to light touch all 4 extremities Coordination: Finger-to- nose-finger intact bilaterally Gait: normal-based   IMPRESSION: 51 year old female with a history of hypercholesterolemia who presented for evaluation of headaches and empty sella on MRI on 07/12/2022 with Dr. Delena Bali.  Likely incidental finding on MRI as ophthalmology exam did not show evidence of papilledema  Her optic discs look sharp on fundus exam today. Will have her send eye doctor notes over to confirm there was no papilledema seen on their exam. In the meantime will start Topamax for migraine prevention and Maxalt for rescue.  PLAN: -Patient will have notes from eye doctor sent over -Prevention: Start Topamax 25 mg QHS, increase by 25 mg weekly up to 100 mg QHS -Rescue: Start Maxalt 10 mg PRN     I spent *** minutes of face-to-face and non-face-to-face time with patient.  This included previsit chart review, lab review, study review, order entry, electronic health record documentation, patient education  Ihor Austin, [facility]  [facility] [address] [place], [place] [address]  Phone [phone] Fax [phone] Note: This document was prepared with digital dictation and possible smart phrase technology. Any transcriptional errors that result from this process are  unintentional.

## 2022-12-01 NOTE — Progress Notes (Signed)
Name: Cynthia Woods   MRN: [id number]    DOB: October 08, 1971   Date:12/02/2022       Progress Note  Subjective  Chief Complaint  Knee Pain  HPI  Empty Sella and headaches: she had a normal fundal exam done by ophthalmologist, seen by neurologist and advised to start topamax and take Maxalt prn. She did not start Topamax , she was afraid of side effects. She states headaches are described as dull/aching or throbbing, intense when present 10/10 , not associated with nausea or sound or light sensitivy, she states episodes are less often with stress control and staying hydrated, sleeping better. She takes if she takes Bayer headache pills and symptoms resolves within one hour She lost to follow up with neurologist but will re-schedule   Dyslipidemia: she is taking statin therapy, last level improved with medication   Right knee pain: about one week ago she noticed pain with flexion of knee, no pain while walking , denies effusion , trauma or increase in warmth. She has not tried naproxen that she has at home.   Vitamin D deficiency: continue supplementation  Patient Active Problem List   Diagnosis Date Noted   Empty sella (HCC) 08/02/2022   Vitamin D deficiency 06/24/2022   Back spasm 06/24/2022   Abnormal cervical Papanicolaou smear 06/22/2022   Abnormal Pap smear of cervix 08/03/21 LGSIL HPV+ 03/14/2022   Pure hypercholesterolemia 05/17/2021   Chronic tension-type headache, not intractable 07/03/2018   Migraine without aura and without status migrainosus, not intractable 07/03/2018   Cervical radiculopathy 07/20/2017   Benign breast cyst in female 09/09/2014    Past Surgical History:  Procedure Laterality Date   BREAST CYST ASPIRATION     COLONOSCOPY WITH PROPOFOL N/A 11/27/2019   Procedure: COLONOSCOPY WITH PROPOFOL;  Surgeon: Earline Mayotte, MD;  Location: [facility] ENDOSCOPY;  Service: Endoscopy;  Laterality: N/A;   FOOT SURGERY  12/06/2007   GYNECOLOGIC CRYOSURGERY   12/06/1991   WISDOM TOOTH EXTRACTION  12/05/2009    Family History  Problem Relation Age of Onset   Other Mother        brain tumor   Other Maternal Aunt        brain tumor   Other Maternal Grandmother        brain tumor   Cancer Maternal Grandmother    Diabetes Maternal Grandfather    Breast cancer Paternal Grandmother    Hypertension Paternal Grandmother    Cancer Paternal Grandfather        colon    Social History   Tobacco Use   Smoking status: Never   Smokeless tobacco: Never  Substance Use Topics   Alcohol use: Yes    Alcohol/week: 2.0 standard drinks of alcohol    Types: 2 Standard drinks or equivalent per week    Comment: last use 03/13/22 3x/mo     Current Outpatient Medications:    celecoxib (CELEBREX) 200 MG capsule, Take 1 capsule (200 mg total) by mouth daily., Disp: 30 capsule, Rfl: 0   Cholecalciferol (VITAMIN D) 50 MCG (2000 UT) CAPS, Take 1 capsule (2,000 Units total) by mouth daily., Disp: 100 capsule, Rfl: 3   diazepam (VALIUM) 2 MG tablet, Take 1 tablet (2 mg total) by mouth every 8 (eight) hours as needed for muscle spasms., Disp: 15 tablet, Rfl: 0   lidocaine (LIDODERM) 5 %, Place 1 patch onto the skin daily. Remove & Discard patch within 12 hours or as directed by MD, Disp: 30 patch, Rfl: 0  Multiple Vitamins-Minerals (WOMENS MULTIVITAMIN PO), Take by mouth., Disp: , Rfl:    rosuvastatin (CRESTOR) 20 MG tablet, Take 1 tablet (20 mg total) by mouth daily., Disp: 90 tablet, Rfl: 3  Allergies  Allergen Reactions   Shellfish Allergy Anaphylaxis   Sulfa Antibiotics    Latex Rash   Naproxen Diarrhea, Nausea Only and Other (See Comments)   Oxycodone Nausea Only and Nausea And Vomiting   Tramadol Nausea Only and Nausea And Vomiting    nausea    I personally reviewed active problem list, medication list, allergies, family history, social history, health maintenance with the patient/caregiver today.   ROS  Constitutional: Negative for fever or  weight change.  Respiratory: Negative for cough and shortness of breath.   Cardiovascular: Negative for chest pain or palpitations.  Gastrointestinal: Negative for abdominal pain, no bowel changes.  Musculoskeletal: Negative for gait problem or joint swelling.  Skin: Negative for rash.  Neurological: Negative for dizziness or headache.  No other specific complaints in a complete review of systems (except as listed in HPI above).   Objective  Vitals:   12/02/22 1431  BP: 112/76  Pulse: 96  Resp: 14  Temp: 98.4 F (36.9 C)  TempSrc: Oral  Weight: 163 lb (73.9 kg)  Height: 5\' 4"  (1.626 m)    Body mass index is 27.98 kg/m.  Physical Exam  Constitutional: Patient appears well-developed and well-nourished.  No distress.  HEENT: head atraumatic, normocephalic, pupils equal and reactive to light, neck supple Cardiovascular: Normal rate, regular rhythm and normal heart sounds.  No murmur heard. No BLE edema. Pulmonary/Chest: Effort normal and breath sounds normal. No respiratory distress. Abdominal: Soft.  There is no tenderness. Muscular skeletal : right knee painful with flexion , mild effusion, no redness or increase in warmth  Psychiatric: Patient has a normal mood and affect. behavior is normal. Judgment and thought content normal.   PHQ2/9:    12/02/2022    2:33 PM 08/02/2022    2:42 PM 08/02/2022    2:27 PM 06/24/2022    1:04 PM 02/09/2022    3:44 PM  Depression screen PHQ 2/9  Decreased Interest 0 0 0 1 0  Down, Depressed, Hopeless 0 0 0 1 0  PHQ - 2 Score 0 0 0 2 0  Altered sleeping 0 2 0 1 0  Tired, decreased energy 0 2 0 0 0  Change in appetite 0 2 0 3 0  Feeling bad or failure about yourself  0 0 0 1 0  Trouble concentrating 0 0 0 0 0  Moving slowly or fidgety/restless 0 0 0 0 0  Suicidal thoughts 0 0 0 0 0  PHQ-9 Score 0 6 0 7 0  Difficult doing work/chores  Not difficult at all Not difficult at all Not difficult at all Not difficult at all    phq 9 is  negative   Fall Risk:    12/02/2022    2:33 PM 08/02/2022    2:41 PM 08/02/2022    2:27 PM 06/24/2022    1:04 PM 02/09/2022    3:44 PM  Fall Risk   Falls in the past year? 0 0 0 0 0  Number falls in past yr:   0  0  Injury with Fall?   0  0  Risk for fall due to : No Fall Risks No Fall Risks No Fall Risks No Fall Risks No Fall Risks  Follow up Falls prevention discussed;Falls evaluation completed;Education provided Falls prevention discussed;Education  provided;Falls evaluation completed Falls prevention discussed;Education provided Falls prevention discussed Education provided      Functional Status Survey: Is the patient deaf or have difficulty hearing?: No Does the patient have difficulty seeing, even when wearing glasses/contacts?: No Does the patient have difficulty concentrating, remembering, or making decisions?: No Does the patient have difficulty walking or climbing stairs?: Yes Does the patient have difficulty dressing or bathing?: No Does the patient have difficulty doing errands alone such as visiting a doctor's office or shopping?: No    Assessment & Plan  1. Acute pain of right knee  - celecoxib (CELEBREX) 200 MG capsule; Take 1 capsule (200 mg total) by mouth daily.  Dispense: 30 capsule; Refill: 0  Advised ice, rest, if no improvement with NSAID's she will let me know and we can place referral to PT or sports medicine/ortho   2. Empty sella (HCC)  Follow up with neurologist   3. Pure hypercholesterolemia  Continue statin therapy   4. Vitamin D deficiency   Continue supplementation

## 2022-12-02 ENCOUNTER — Encounter: Payer: Self-pay | Admitting: Family Medicine

## 2022-12-02 ENCOUNTER — Ambulatory Visit (INDEPENDENT_AMBULATORY_CARE_PROVIDER_SITE_OTHER): Payer: BC Managed Care – PPO | Admitting: Family Medicine

## 2022-12-02 VITALS — BP 112/76 | HR 96 | Temp 98.4°F | Resp 14 | Ht 64.0 in | Wt 163.0 lb

## 2022-12-02 DIAGNOSIS — E559 Vitamin D deficiency, unspecified: Secondary | ICD-10-CM | POA: Diagnosis not present

## 2022-12-02 DIAGNOSIS — E236 Other disorders of pituitary gland: Secondary | ICD-10-CM

## 2022-12-02 DIAGNOSIS — E7800 Pure hypercholesterolemia, unspecified: Secondary | ICD-10-CM

## 2022-12-02 DIAGNOSIS — M25561 Pain in right knee: Secondary | ICD-10-CM

## 2022-12-02 MED ORDER — CELECOXIB 200 MG PO CAPS: 200.0000 mg | ORAL_CAPSULE | Freq: Every day | ORAL | 0 refills | Status: DC

## 2022-12-22 ENCOUNTER — Encounter: Payer: Self-pay | Admitting: Family Medicine

## 2022-12-22 ENCOUNTER — Ambulatory Visit: Payer: BC Managed Care – PPO | Admitting: Family Medicine

## 2022-12-22 ENCOUNTER — Telehealth (INDEPENDENT_AMBULATORY_CARE_PROVIDER_SITE_OTHER): Payer: BC Managed Care – PPO | Admitting: Family Medicine

## 2022-12-22 DIAGNOSIS — G44209 Tension-type headache, unspecified, not intractable: Secondary | ICD-10-CM | POA: Diagnosis not present

## 2022-12-22 NOTE — Progress Notes (Signed)
Name: Cynthia Woods   MRN: [id number]    DOB: February 19, 1971   Date:12/22/2022       Progress Note  Subjective  Chief Complaint  Chief Complaint  Patient presents with   Headache    Headache started today, 12/22/22.    I connected with  Jeani Sow  on 12/22/22 at  2:20 PM EST by a video enabled telemedicine application and verified that I am speaking with the correct person using two identifiers.  I discussed the limitations of evaluation and management by telemedicine and the availability of in person appointments. The patient expressed understanding and agreed to proceed with a virtual visit  Staff also discussed with the patient that there may be a patient responsible charge related to this service. Patient Location: at home  Provider Location: [facility] Additional Individuals present: alone   HPI  Tension headache: she has  she states she woke up with a headache, temporal area and was 4-5/10, she went to her daytime job and headache got progressive worse  around 6/10 so she went home at lunch time when pain  radiated to her nuchal area ( typical ) she took Bayer Headache and pain is subsiding, down to 3/10 . She states if she does not take medication for her tension headaches they will turn into a migraine episode. 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 This was her first week. She thinks migraine was triggered by new routine   Patient Active Problem List   Diagnosis Date Noted   Empty sella ([place]) 08/02/2022   Vitamin D deficiency 06/24/2022   Back spasm 06/24/2022   Abnormal cervical Papanicolaou smear 06/22/2022   Abnormal Pap smear of cervix 08/03/21 LGSIL HPV+ 03/14/2022   Pure hypercholesterolemia 05/17/2021   Chronic tension-type headache, not intractable  07/03/2018   Migraine without aura and without status migrainosus, not intractable 07/03/2018   Cervical radiculopathy 07/20/2017   Benign breast cyst in female 09/09/2014    Social History   Tobacco Use   Smoking status: Never   Smokeless tobacco: Never  Substance Use Topics   Alcohol use: Yes    Alcohol/week: 2.0 standard drinks of alcohol    Types: 2 Standard drinks or equivalent per week    Comment: last use 03/13/22 3x/mo     Current Outpatient Medications:    celecoxib (CELEBREX) 200 MG capsule, Take 1 capsule (200 mg total) by mouth daily., Disp: 30 capsule, Rfl: 0   Cholecalciferol (VITAMIN D) 50 MCG (2000 UT) CAPS, Take 1 capsule (2,000 Units total) by mouth daily., Disp: 100 capsule, Rfl: 3   diazepam (VALIUM) 2 MG tablet, Take 1 tablet (2 mg total) by mouth every 8 (eight) hours as needed for muscle spasms., Disp: 15 tablet, Rfl: 0   lidocaine (LIDODERM) 5 %, Place 1 patch onto the skin daily. Remove & Discard patch within 12 hours or as directed by MD, Disp: 30 patch, Rfl: 0   Multiple Vitamins-Minerals (WOMENS MULTIVITAMIN PO), Take by mouth., Disp: , Rfl:    rosuvastatin (CRESTOR) 20 MG tablet, Take 1 tablet (20 mg total) by mouth daily., Disp: 90 tablet, Rfl: 3  Allergies  Allergen Reactions   Shellfish Allergy Anaphylaxis   Sulfa Antibiotics    Latex Rash   Naproxen Diarrhea,  Nausea Only and Other (See Comments)   Oxycodone Nausea Only and Nausea And Vomiting   Tramadol Nausea Only and Nausea And Vomiting    nausea    I personally reviewed active problem list, medication list, allergies, family history with the patient/caregiver today.  ROS  Ten systems reviewed and is negative except as mentioned in HPI   Objective  Virtual encounter, vitals not obtained.  There is no height or weight on file to calculate BMI.  Nursing Note and Vital Signs reviewed.  Physical Exam  Awake, alert and oriented laying in her bed  Assessment & Plan  1. Acute non  intractable tension-type headache  Rest, fluids, excuse for work, continue medications prn    -Red flags and when to present for emergency care or RTC including fever >101.62F, chest pain, shortness of breath, new/worsening/un-resolving symptoms,  reviewed with patient at time of visit. Follow up and care instructions discussed and provided in AVS. - I discussed the assessment and treatment plan with the patient. The patient was provided an opportunity to ask questions and all were answered. The patient agreed with the plan and demonstrated an understanding of the instructions.  I provided 15  minutes of non-face-to-face time during this encounter.  Loistine Chance, MD

## 2023-01-12 ENCOUNTER — Ambulatory Visit
Admission: RE | Admit: 2023-01-12 | Discharge: 2023-01-12 | Disposition: A | Discharge status: Home / Self Care | Payer: BC Managed Care – PPO | Source: Ambulatory Visit | Attending: Family Medicine | Admitting: Family Medicine

## 2023-01-12 DIAGNOSIS — Z1231 Encounter for screening mammogram for malignant neoplasm of breast: Secondary | ICD-10-CM | POA: Diagnosis not present

## 2023-01-25 DIAGNOSIS — N760 Acute vaginitis: Secondary | ICD-10-CM | POA: Diagnosis not present

## 2023-01-25 DIAGNOSIS — Z113 Encounter for screening for infections with a predominantly sexual mode of transmission: Secondary | ICD-10-CM | POA: Diagnosis not present

## 2023-01-25 DIAGNOSIS — R8290 Unspecified abnormal findings in urine: Secondary | ICD-10-CM | POA: Diagnosis not present

## 2023-03-17 ENCOUNTER — Other Ambulatory Visit (HOSPITAL_COMMUNITY)
Admission: RE | Admit: 2023-03-17 | Discharge: 2023-03-17 | Disposition: A | Discharge status: Home / Self Care | Payer: BC Managed Care – PPO | Source: Ambulatory Visit | Attending: Family Medicine | Admitting: Family Medicine

## 2023-03-17 ENCOUNTER — Encounter: Payer: Self-pay | Admitting: Family Medicine

## 2023-03-17 ENCOUNTER — Ambulatory Visit (INDEPENDENT_AMBULATORY_CARE_PROVIDER_SITE_OTHER): Payer: BC Managed Care – PPO | Admitting: Family Medicine

## 2023-03-17 VITALS — BP 106/70 | HR 100 | Temp 97.8°F | Resp 16 | Ht 64.0 in | Wt 164.4 lb

## 2023-03-17 DIAGNOSIS — N761 Subacute and chronic vaginitis: Secondary | ICD-10-CM

## 2023-03-17 DIAGNOSIS — B9689 Other specified bacterial agents as the cause of diseases classified elsewhere: Secondary | ICD-10-CM | POA: Diagnosis not present

## 2023-03-17 DIAGNOSIS — N760 Acute vaginitis: Secondary | ICD-10-CM | POA: Diagnosis not present

## 2023-03-17 NOTE — Progress Notes (Signed)
Patient ID: Cynthia Woods, female    DOB: 11-Sep-1971, 52 y.o.   MRN: [id number]  PCP: Alba Cory, MD  Chief Complaint  Patient presents with   Retest BV    No sx just want to make sure its clear    Subjective:   Cynthia Woods is a 52 y.o. female, presents to clinic with CC of the following:  HPI  Pt with recurrent BV treated by GYN last 2/21 She went on a cruise and she is super prone to get again with slight changes to heat/environment  She has GYN manage with culturelle probiotic, careful with soaps  Last tx was metronidazole  Patient Active Problem List   Diagnosis Date Noted   Empty sella 08/02/2022   Vitamin D deficiency 06/24/2022   Back spasm 06/24/2022   Abnormal cervical Papanicolaou smear 06/22/2022   Abnormal Pap smear of cervix 08/03/21 LGSIL HPV+ 03/14/2022   Pure hypercholesterolemia 05/17/2021   Chronic tension-type headache, not intractable 07/03/2018   Migraine without aura and without status migrainosus, not intractable 07/03/2018   Cervical radiculopathy 07/20/2017   Benign breast cyst in female 09/09/2014      Current Outpatient Medications:    celecoxib (CELEBREX) 200 MG capsule, Take 1 capsule (200 mg total) by mouth daily., Disp: 30 capsule, Rfl: 0   Cholecalciferol (VITAMIN D) 50 MCG (2000 UT) CAPS, Take 1 capsule (2,000 Units total) by mouth daily., Disp: 100 capsule, Rfl: 3   diazepam (VALIUM) 2 MG tablet, Take 1 tablet (2 mg total) by mouth every 8 (eight) hours as needed for muscle spasms., Disp: 15 tablet, Rfl: 0   lidocaine (LIDODERM) 5 %, Place 1 patch onto the skin daily. Remove & Discard patch within 12 hours or as directed by MD, Disp: 30 patch, Rfl: 0   Multiple Vitamins-Minerals (WOMENS MULTIVITAMIN PO), Take by mouth., Disp: , Rfl:    rosuvastatin (CRESTOR) 20 MG tablet, Take 1 tablet (20 mg total) by mouth daily., Disp: 90 tablet, Rfl: 3   Allergies  Allergen Reactions   Shellfish Allergy Anaphylaxis   Sulfa  Antibiotics    Latex Rash   Naproxen Diarrhea, Nausea Only and Other (See Comments)   Oxycodone Nausea Only and Nausea And Vomiting   Tramadol Nausea Only and Nausea And Vomiting    nausea     Social History   Tobacco Use   Smoking status: Never   Smokeless tobacco: Never  Vaping Use   Vaping Use: Never used  Substance Use Topics   Alcohol use: Yes    Alcohol/week: 2.0 standard drinks of alcohol    Types: 2 Standard drinks or equivalent per week    Comment: last use 03/13/22 3x/mo   Drug use: No      Chart Review Today: I personally reviewed active problem list, medication list, allergies, family history, social history, health maintenance, notes from last encounter, lab results, imaging with the patient/caregiver today.   Review of Systems  Constitutional: Negative.   HENT: Negative.    Eyes: Negative.   Respiratory: Negative.    Cardiovascular: Negative.   Gastrointestinal: Negative.   Endocrine: Negative.   Genitourinary: Negative.   Musculoskeletal: Negative.   Skin: Negative.   Allergic/Immunologic: Negative.   Neurological: Negative.   Hematological: Negative.   Psychiatric/Behavioral: Negative.    All other systems reviewed and are negative.      Objective:   Vitals:   03/17/23 1448  BP: 106/70  Pulse: 100  Resp: 16  Temp: 97.8  F (36.6 C)  TempSrc: Oral  SpO2: 98%  Weight: 164 lb 6.4 oz (74.6 kg)  Height: 5\' 4"  (1.626 m)    Body mass index is 28.22 kg/m.  Physical Exam Vitals and nursing note reviewed.  Constitutional:      General: She is not in acute distress.    Appearance: Normal appearance. She is well-developed and normal weight. She is not ill-appearing, toxic-appearing or diaphoretic.  HENT:     Head: Normocephalic and atraumatic.     Nose: Nose normal.  Eyes:     General:        Right eye: No discharge.        Left eye: No discharge.     Conjunctiva/sclera: Conjunctivae normal.  Neck:     Trachea: No tracheal deviation.   Cardiovascular:     Rate and Rhythm: Normal rate and regular rhythm.     Pulses: Normal pulses.     Heart sounds: Normal heart sounds.  Pulmonary:     Effort: Pulmonary effort is normal. No respiratory distress.     Breath sounds: No stridor.  Abdominal:     General: Bowel sounds are normal. There is no distension.     Tenderness: There is no abdominal tenderness. There is no right CVA tenderness, left CVA tenderness, guarding or rebound.  Musculoskeletal:        General: Normal range of motion.  Skin:    General: Skin is warm and dry.     Findings: No rash.  Neurological:     Mental Status: She is alert.     Motor: No abnormal muscle tone.     Coordination: Coordination normal.  Psychiatric:        Mood and Affect: Mood normal.        Behavior: Behavior normal.      Results for orders placed or performed in visit on 08/03/22  HM PAP SMEAR  Result Value Ref Range   HM Pap smear Negative        Assessment & Plan:     Pt with BV in Feb, she has it often and she would like to be rechecked today though she is only have scant amount of "normal" vaginal discharge  1. Subacute vaginitis Recheck swab- pt prefers to be treated no matter what sx are, we discussed other management such as boric acid, suppressive BV tx, she is already being careful with soaps/bathing and takes womans probiotic - Cervicovaginal ancillary only  2. BV (bacterial vaginosis) Will follow results and treat positive findings Pt prefers oral flagyl if BV+ - Cervicovaginal ancillary only     Danelle Berry, PA-C 03/17/23 5:54 PM

## 2023-03-20 ENCOUNTER — Encounter: Payer: Self-pay | Admitting: Family Medicine

## 2023-03-21 ENCOUNTER — Other Ambulatory Visit: Payer: Self-pay | Admitting: Family Medicine

## 2023-03-21 LAB — CERVICOVAGINAL ANCILLARY ONLY
Bacterial Vaginitis (gardnerella): POSITIVE — AB
Candida Glabrata: NEGATIVE
Candida Vaginitis: NEGATIVE
Chlamydia: NEGATIVE
Comment: NEGATIVE
Comment: NEGATIVE
Comment: NEGATIVE
Comment: NEGATIVE
Comment: NEGATIVE
Comment: NORMAL
Neisseria Gonorrhea: NEGATIVE
Trichomonas: NEGATIVE

## 2023-03-21 MED ORDER — METRONIDAZOLE 500 MG PO TABS: 500.0000 mg | ORAL_TABLET | Freq: Two times a day (BID) | ORAL | 0 refills | Status: DC

## 2023-03-25 ENCOUNTER — Encounter: Payer: Self-pay | Admitting: Family Medicine

## 2023-03-27 MED ORDER — METRONIDAZOLE 0.75 % VA GEL: 1.0000 | Freq: Every day | VAGINAL | 0 refills | Status: DC

## 2023-05-15 ENCOUNTER — Other Ambulatory Visit: Payer: Self-pay

## 2023-05-15 ENCOUNTER — Telehealth: Payer: Self-pay | Admitting: Family Medicine

## 2023-05-15 DIAGNOSIS — G44229 Chronic tension-type headache, not intractable: Secondary | ICD-10-CM

## 2023-05-15 NOTE — Telephone Encounter (Unsigned)
Copied from CRM [id number]. Topic: Referral - Request for Referral >> May 15, 2023  3:01 PM Marlow Baars wrote: Has patient seen PCP for this complaint? Yes.   Referral for which specialty: Neurology Preferred provider/office: [facility] Reason for referral: Headache, weakness in hands  The patient says she has new insurance and she was not happy with her previous Neurologist. She has done research and if in network she would like a referral to [facility] on [facility]. Please assist patient further

## 2023-05-22 ENCOUNTER — Ambulatory Visit (INDEPENDENT_AMBULATORY_CARE_PROVIDER_SITE_OTHER): Payer: BC Managed Care – PPO | Admitting: Physician Assistant

## 2023-05-22 ENCOUNTER — Encounter: Payer: Self-pay | Admitting: Physician Assistant

## 2023-05-22 ENCOUNTER — Other Ambulatory Visit: Payer: Self-pay

## 2023-05-22 ENCOUNTER — Telehealth: Payer: Self-pay

## 2023-05-22 ENCOUNTER — Ambulatory Visit: Payer: BC Managed Care – PPO | Admitting: Family Medicine

## 2023-05-22 VITALS — BP 122/70 | HR 98 | Temp 97.9°F | Resp 16 | Ht 64.0 in | Wt 166.5 lb

## 2023-05-22 DIAGNOSIS — M79642 Pain in left hand: Secondary | ICD-10-CM | POA: Diagnosis not present

## 2023-05-22 DIAGNOSIS — M79672 Pain in left foot: Secondary | ICD-10-CM

## 2023-05-22 DIAGNOSIS — M79641 Pain in right hand: Secondary | ICD-10-CM

## 2023-05-22 NOTE — Patient Instructions (Addendum)
I suspect that you may have plantar fasciitis in your left foot  In addition to the included information and rehab I recommend stepping onto a tennis ball or frozen bottle of water and rolling your foot along the object for about 2-5 minutes several times per day. This can help massage the fascia and reduce inflammation   If this is not improving your symptoms let us know and we can make a referral to podiatry   I recommend a splint for your finger to help immobilize it and prevent further injuries   I recommend using Tylenol and Ibuprofen as needed for pain   I recommend using wrist braces for your hand pain- especially at night to immobilize them   If your symptoms are not improving with these measures please let us know and we can refer you to Orthopedics for further evaluation

## 2023-05-22 NOTE — Progress Notes (Unsigned)
Acute Office Visit   Patient: Cynthia Woods   DOB: July 20, 1971   52 y.o. Female  MRN: [id number] Visit Date: 05/22/2023  Today's healthcare provider: Oswaldo Conroy Ranae Casebier, PA-C  Introduced myself to the patient as a Secondary school teacher and provided education on APPs in clinical practice.    Chief Complaint  Patient presents with   Foot Injury    Left foot injury   Hand Pain    Bilateral hand pain   Subjective    HPI HPI     Foot Injury    Additional comments: Left foot injury        Hand Pain    Additional comments: Bilateral hand pain      Last edited by Riesa Pope, RMA on 05/22/2023 10:54 AM.      Cynthia Woods reports stepping on a large buckle from a step ladder Cynthia Woods stepped on it about a month ago  Cynthia Woods reports increased pain over the weekend while Cynthia Woods was in the process of moving Cynthia Woods denies the buckle breaking skin when Cynthia Woods stepped on it Cynthia Woods also reports having to wear steel toe boots at work  Cynthia Woods reports pain in the center of her foot on the bottom and along the fifth metatarsal  Interventions: none   Hand Pain Cynthia Woods reports left ring finger pain  Cynthia Woods thinks Cynthia Woods may have hit it on something  Pain is 7/10 but will go up to 10/10 if Cynthia Woods hits it- Cynthia Woods states Cynthia Woods hits it almost every day   Cynthia Woods also reports bilateral hand pain - Cynthia Woods states the whole hand feels achy and making  a fist seems to hurt  Cynthia Woods reports 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   Cholecalciferol (VITAMIN D) 50 MCG (2000 UT) CAPS Take 1 capsule (2,000 Units total) by mouth daily.   diazepam (VALIUM) 2 MG tablet Take 1 tablet (2 mg total) by mouth every 8 (eight) hours as  needed for muscle spasms.   Multiple Vitamins-Minerals (WOMENS MULTIVITAMIN PO) Take by mouth.   rosuvastatin (CRESTOR) 20 MG tablet Take 1 tablet (20 mg total) by mouth daily.   lidocaine (LIDODERM) 5 % Place 1 patch onto the skin daily. Remove & Discard patch within 12 hours or as directed by MD (Patient not taking: Reported on 05/22/2023)   metroNIDAZOLE (METROGEL) 0.75 % vaginal gel Place 1 Applicatorful vaginally at bedtime. X 5 d (Patient not taking: Reported on 05/22/2023)   No facility-administered medications prior to visit.    Review of Systems  Musculoskeletal:  Positive for joint swelling (left ring finger) and myalgias.       Hand pain - bilateral   Neurological:  Positive for numbness (mild intermittent numbness in both hands).       Objective    BP 122/70   Pulse 98   Temp 97.9 F (36.6 C) (Oral)   Resp 16   Ht 5\' 4"  (1.626 m)   Wt 166 lb 8 oz (75.5 kg)   SpO2 99%   BMI 28.58 kg/m    Physical Exam Vitals reviewed.  Constitutional:      General:  Cynthia Woods is awake.     Appearance: Normal appearance. Cynthia Woods is well-developed and well-groomed.  HENT:     Head: Normocephalic and atraumatic.  Cardiovascular:     Pulses:          Dorsalis pedis pulses are 2+ on the left side.  Musculoskeletal:     Right wrist: Normal. No swelling, tenderness, bony tenderness, snuff box tenderness or crepitus. Normal range of motion. Normal pulse.     Left wrist: Normal. No swelling, tenderness, bony tenderness, snuff box tenderness or crepitus. Normal range of motion. Normal pulse.     Right hand: Normal. No swelling, deformity or tenderness. Normal range of motion. Normal strength. Normal sensation. Normal capillary refill.     Left hand: Normal. No swelling, deformity or tenderness. Normal range of motion. Normal strength. Normal sensation. Normal capillary refill.     Left foot: No deformity.  Feet:     Right foot:     Toenail Condition: Right toenails are normal.     Left foot:      Skin integrity: Skin integrity normal. No ulcer, blister, skin breakdown, erythema, warmth, callus, dry skin or fissure.     Toenail Condition: Left toenails are normal.  Neurological:     General: No focal deficit present.     Mental Status: Cynthia Woods is alert and oriented to person, place, and time. Mental status is at baseline.     GCS: GCS eye subscore is 4. GCS verbal subscore is 5. GCS motor subscore is 6.     Cranial Nerves: No dysarthria or facial asymmetry.     Motor: Motor function is intact. No weakness or tremor.  Psychiatric:        Attention and Perception: Attention and perception normal.        Mood and Affect: Mood and affect normal.        Speech: Speech normal.        Behavior: Behavior normal. Behavior is cooperative.       No results found for any visits on 05/22/23.  Assessment & Plan      No follow-ups on file.     Problem List Items Addressed This Visit   None Visit Diagnoses     Left foot pain    -  Primary Acute, new concern Patient reports persistent left foot pain after stepping onto a belt buckle about a month ago Cynthia Woods denies skin penetration at time of injury and skin appears overall intact at this time Cynthia Woods is up-to-date on her tetanus shot there is no active bruising, swelling, streaking, redness, increased warmth to the area Suspect likely deep tissue bruising or potential plantar fasciitis at this time Recommend the patient uses Tylenol, ibuprofen as needed for discomfort.  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 Physical exam is overall unremarkable, ROM and strength are intact  bilaterally and symmetrical Differential diagnosis includes but is not limited to: Repetitive motion injury, bilateral carpal tunnel syndrome, radiculopathy We discussed using Tylenol, ibuprofen, muscle relaxers to assist with pain but patient was skeptical that these would work as Cynthia Woods reports minimal relief with ibuprofen or NSAIDs in the past Patient already has an active prescription of Valium that Cynthia Woods uses as needed -we discussed that combining this with another muscle relaxer or other similar medication could cause adverse reactions Recommend using a splint or brace on the ring finger to reduce shock and damage from impact, also being more conscientious of her hands and trying to avoid impact to the area I also recommend wrist braces to assist with stability and this would also  help with potential carpal tunnel syndrome If symptoms or not resolving or if they are worsening over the next few weeks Cynthia Woods should return to the office and we can discuss referral to orthopedics, hand specialty, physical therapy as indicated Follow-up as needed for persistent or progressing symptoms        No follow-ups on file.   I, Baron Parmelee E Brayah Urquilla, PA-C, have reviewed all documentation for this visit. The documentation on 05/23/23 for the exam, diagnosis, procedures, and orders are all accurate and complete.   Jacquelin Hawking, MHS, PA-C [facility]

## 2023-05-22 NOTE — Telephone Encounter (Signed)
Copied from CRM [id number]. Topic: General - Other >> May 22, 2023 11:46 AM Alfred Levins wrote: Pt . Cynthia Woods stated that she was not pleased with todays June 17 th visit, with the PA Erin Meccum. She felt as if her concerns were glossed over and the plan of care that was given is not to her satisfaction. Patient would like to speak to her PCP, Dr Carlynn Purl

## 2023-05-25 ENCOUNTER — Encounter: Payer: Self-pay | Admitting: Internal Medicine

## 2023-05-25 ENCOUNTER — Ambulatory Visit (INDEPENDENT_AMBULATORY_CARE_PROVIDER_SITE_OTHER): Payer: BC Managed Care – PPO | Admitting: Family Medicine

## 2023-05-25 VITALS — BP 116/74 | HR 112 | Temp 98.7°F | Resp 18 | Ht 64.0 in | Wt 166.4 lb

## 2023-05-25 DIAGNOSIS — K1120 Sialoadenitis, unspecified: Secondary | ICD-10-CM | POA: Diagnosis not present

## 2023-05-25 DIAGNOSIS — J029 Acute pharyngitis, unspecified: Secondary | ICD-10-CM | POA: Diagnosis not present

## 2023-05-25 DIAGNOSIS — M79672 Pain in left foot: Secondary | ICD-10-CM

## 2023-05-25 DIAGNOSIS — M25561 Pain in right knee: Secondary | ICD-10-CM

## 2023-05-25 MED ORDER — CELECOXIB 100 MG PO CAPS: 100.0000 mg | ORAL_CAPSULE | Freq: Two times a day (BID) | ORAL | 0 refills | Status: DC

## 2023-05-25 MED ORDER — AMOXICILLIN-POT CLAVULANATE 875-125 MG PO TABS
1.0000 | ORAL_TABLET | Freq: Two times a day (BID) | ORAL | 0 refills | Status: DC
Start: 2023-05-25 — End: 2023-08-11

## 2023-05-25 NOTE — Patient Instructions (Signed)
Salivary Gland Infection  A salivary gland infection is an infection in one or more of the glands that make saliva. There are six major salivary glands. Each gland has a tube (duct) that carries saliva into the mouth. Saliva keeps the mouth moist and breaks down the food that a person eats. It also helps prevent tooth decay. You have two salivary glands just in front of your ears (parotid). The ducts for these glands open up inside your cheeks, near your back teeth. You also have two glands under your tongue (sublingual) and two glands under your jaw (submandibular). The ducts for these glands open under your tongue. Any salivary gland can get infected. Most infections occur in the parotid glands or submandibular glands. What are the causes? This condition may be caused by bacteria or viruses. The bacteria that cause salivary gland infections are usually the same bacteria that normally live in your mouth. A stone can form in a salivary gland and block the flow of saliva. Bacteria may then start to grow behind the blockage and cause infection. Bacterial infections usually cause pain and swelling on one side of the face. Submandibular gland swelling occurs under the jaw. Parotid swelling occurs in front of the ear. Bacterial infections are more common in adults. The most common cause of viral salivary gland infections is the mumps virus. However, vaccination has made mumps rare. This infection causes swelling in both parotid glands. Viral infections are more common in children. What increases the risk? You are more likely to develop a bacterial infection if: You do not take good care of your mouth and teeth (have poor oral hygiene). You smoke. You do not drink enough water. You have a disease that causes dry mouth and dry eyes (Mikulicz syndrome or Sjgren syndrome). A viral infection is more likely to occur in children who do not get the measles, mumps, and rubella (MMR) vaccine. What are the  signs or symptoms? The main sign of a salivary gland infection is sialadenitis, which is swelling in a salivary gland. You may have swelling in front of your ear, under your jaw, or under your tongue. Swelling may get worse when you eat and decrease after you eat. Other signs and symptoms include: Pain. Tenderness. Redness. Dry mouth. Bad taste in your mouth. Trouble chewing and swallowing. Fever. How is this diagnosed? This condition may be diagnosed based on: Your signs and symptoms. A physical exam. During the exam, your health care provider will look and feel inside your mouth to see whether a stone is blocking a salivary gland duct. Tests, such as: An X-ray to check for a stone. An ultrasound, CT scan, or MRI to look for an infected gland (abscess) and to rule out other causes of swelling. A culture and sensitivity test. In this test, a sample of pus is taken from the salivary gland by using a swab or a needle (aspiration). The sample is tested in a lab to determine the type of bacteria involved and which antibiotic medicines will work against it. You may need to see an ear, nose, and throat specialist (ENT or otolaryngologist) for diagnosis and treatment. How is this treated? Viral salivary gland infections usually clear up without treatment. Bacterial infections are usually treated with antibiotic medicine. Severe infections that cause trouble swallowing may be treated with an IV antibiotic in the hospital. Other treatments may include: Probing and widening the salivary duct to let a stone pass. In some cases, a thin, flexible scope (endoscope) may be  put into the duct to find a stone and remove it. Breaking up a stone using sound waves. Draining an abscess with a needle. Surgery to: Remove a stone. Drain pus from an abscess. Remove a gland that has a bad or long-term infection. Follow these instructions at home: Medicines Take over-the-counter and prescription medicines only as  told by your health care provider. If you were prescribed an antibiotic medicine, take it as told by your health care provider. Do not stop taking the antibiotic even if you start to feel better. To relieve discomfort Follow these instructions every few hours: Suck on a lemon candy or a vitamin C lozenge to prompt the flow of saliva. Put a warm, wet cloth (warmcompress) over the gland. Gently massage the gland. Gargle with a mixture of salt and water 3-4 times a day or as needed. To make salt water, completely dissolve -1 tsp (3-6 g) of salt in 1 cup (237 mL) of warm water. General instructions  Practice good oral hygiene by brushing and flossing your teeth after meals and before you go to bed. Drink enough fluid to keep your urine pale yellow. Do not use any products that contain nicotine or tobacco. These products include cigarettes, chewing tobacco, and vaping devices, such as e-cigarettes. If you need help quitting, ask your health care provider. Keep all follow-up visits. This is important. Contact a health care provider if: You have pain and swelling in your face, jaw, or mouth after eating. You keep having swelling in any of these places: In front of your ear. Under your jaw. Inside your mouth. Get help right away if: You have pain and swelling in your face, jaw, or mouth that suddenly gets worse. Your pain and swelling make it hard to swallow, talk, or breathe. These symptoms may be an emergency. Get help right away. Call 911. Do not wait to see if the symptoms will go away. Do not drive yourself to the hospital. Summary A salivary gland infection is an infection in one or more of the glands that make saliva. Any salivary gland can get infected. This condition may be caused by bacteria or viruses. Salivary gland infections caused by a virus usually clear up without treatment. Bacterial infections are usually treated with antibiotic medicine. This information is not intended to  replace advice given to you by your health care provider. Make sure you discuss any questions you have with your health care provider. Document Revised: 11/17/2021 Document Reviewed: 11/17/2021 Elsevier Patient Education  2024 [employer].

## 2023-05-25 NOTE — Progress Notes (Signed)
Name: Cynthia Woods   MRN: [id number]    DOB: 08-Nov-1971   Date:05/25/2023       Progress Note  Subjective  Chief Complaint  Chief Complaint  Patient presents with   Sore Throat    X2 day with post nasal drip   Foot Pain    Seen erine earlier this week, stepped on belt buckle about a month ago    HPI  Sore throat: she states woke up two days ago , pain is on right side only , worse in am, better as the day progresses . No fever or chills.   Left foot pain: she states about 6 weeks ago she stepped down from a step ladder on a belt buckle. She rolled her foot and has noticed pain since that day. She states pain continues to be on mid foot but also on left lateral foot. She states pain is worse when she wears her work boots / steal toe shoes. She recently moved and had to use the stairs also.    Patient Active Problem List   Diagnosis Date Noted   Empty sella (HCC) 08/02/2022   Vitamin D deficiency 06/24/2022   Back spasm 06/24/2022   Abnormal cervical Papanicolaou smear 06/22/2022   Abnormal Pap smear of cervix 08/03/21 LGSIL HPV+ 03/14/2022   Pure hypercholesterolemia 05/17/2021   Chronic tension-type headache, not intractable 07/03/2018   Migraine without aura and without status migrainosus, not intractable 07/03/2018   Cervical radiculopathy 07/20/2017   Benign breast cyst in female 09/09/2014    Social History   Tobacco Use   Smoking status: Never   Smokeless tobacco: Never  Substance Use Topics   Alcohol use: Yes    Alcohol/week: 2.0 standard drinks of alcohol    Types: 2 Standard drinks or equivalent per week    Comment: last use 03/13/22 3x/mo     Current Outpatient Medications:    celecoxib (CELEBREX) 200 MG capsule, Take 1 capsule (200 mg total) by mouth daily., Disp: 30 capsule, Rfl: 0   Cholecalciferol (VITAMIN D) 50 MCG (2000 UT) CAPS, Take 1 capsule (2,000 Units total) by mouth daily., Disp: 100 capsule, Rfl: 3   diazepam (VALIUM) 2 MG tablet, Take 1  tablet (2 mg total) by mouth every 8 (eight) hours as needed for muscle spasms., Disp: 15 tablet, Rfl: 0   Multiple Vitamins-Minerals (WOMENS MULTIVITAMIN PO), Take by mouth., Disp: , Rfl:    rosuvastatin (CRESTOR) 20 MG tablet, Take 1 tablet (20 mg total) by mouth daily., Disp: 90 tablet, Rfl: 3  Allergies  Allergen Reactions   Shellfish Allergy Anaphylaxis   Sulfa Antibiotics    Latex Rash   Naproxen Diarrhea, Nausea Only and Other (See Comments)   Oxycodone Nausea Only and Nausea And Vomiting   Tramadol Nausea Only and Nausea And Vomiting    nausea    ROS  Ten systems reviewed and is negative except as mentioned in HPI   Objective  Vitals:   05/25/23 1427  BP: 116/74  Pulse: (!) 112  Resp: 18  Temp: 98.7 F (37.1 C)  SpO2: 99%  Weight: 166 lb 6.4 oz (75.5 kg)  Height: 5\' 4"  (1.626 m)    Body mass index is 28.56 kg/m.    Physical Exam  Constitutional: Patient appears well-developed and well-nourished.  No distress.  HEENT: head atraumatic, normocephalic, pupils equal and reactive to light, neck supple, mild erythema on right side of throat, tender right submandibularly gland  Cardiovascular: Normal rate, regular rhythm and  normal heart sounds.  No murmur heard. No BLE edema. Pulmonary/Chest: Effort normal and breath sounds normal. No respiratory distress. Abdominal: Soft.  There is no tenderness. Muscular skeletal: pain during palpation of right lateral column , wearing a brace for support  Psychiatric: Patient has a normal mood and affect. behavior is normal. Judgment and thought content normal.   Recent Results (from the past 2160 hour(s))  Cervicovaginal ancillary only     Status: Abnormal   Collection Time: 03/17/23  3:06 PM  Result Value Ref Range   Neisseria Gonorrhea Negative    Chlamydia Negative    Trichomonas Negative    Bacterial Vaginitis (gardnerella) Positive (A)    Candida Vaginitis Negative    Candida Glabrata Negative    Comment Normal  Reference Range Candida Species - Negative    Comment Normal Reference Range Candida Galbrata - Negative    Comment Normal Reference Range Trichomonas - Negative    Comment Normal Reference Ranger Chlamydia - Negative    Comment      Normal Reference Range Neisseria Gonorrhea - Negative   Comment      Normal Reference Range Bacterial Vaginosis - Negative     Assessment & Plan   1. Sore throat  - POCT rapid strep A  2. Foot pain, left  - Ambulatory referral to Podiatry - celecoxib (CELEBREX) 100 MG capsule; Take 1 capsule (100 mg total) by mouth 2 (two) times daily.  Dispense: 60 capsule; Refill: 0  Needs to be able to wear footwear that allows her to keep brace on, until seen by podiatrist   3. Sialoadenitis  - amoxicillin-clavulanate (AUGMENTIN) 875-125 MG tablet; Take 1 tablet by mouth 2 (two) times daily.  Dispense: 14 tablet; Refill: 0

## 2023-05-29 NOTE — Telephone Encounter (Unsigned)
Copied from CRM [id number]. Topic: General - Other >> May 29, 2023 10:44 AM Lennox Pippins wrote: Patient called in and stated that from her appointment with Dr Carlynn Purl, on 05/25/2023, she gave her note from Dr. Carlynn Purl to her job stating that she could wear comfortable shoes to work, but instead of her job accommodating her, they have taken her completely out of work & she stated that [employer] would be faxing over forms to get access to medical records to fax # [phone] and that she would love to be seen sooner at podiatry if possible. Patient stated if Dr Carlynn Purl needed to reach her, please contact her at [phone], as she will be out of work now.

## 2023-06-01 ENCOUNTER — Telehealth: Payer: Self-pay

## 2023-06-01 NOTE — Telephone Encounter (Signed)
Fax received and paperwork completed for [employer]

## 2023-06-09 ENCOUNTER — Encounter: Payer: Self-pay | Admitting: Family Medicine

## 2023-06-12 ENCOUNTER — Encounter: Payer: Self-pay | Admitting: Family Medicine

## 2023-06-13 DIAGNOSIS — M79672 Pain in left foot: Secondary | ICD-10-CM | POA: Diagnosis not present

## 2023-06-13 DIAGNOSIS — S93602D Unspecified sprain of left foot, subsequent encounter: Secondary | ICD-10-CM | POA: Diagnosis not present

## 2023-06-22 ENCOUNTER — Ambulatory Visit: Payer: BC Managed Care – PPO | Admitting: Family Medicine

## 2023-07-03 ENCOUNTER — Encounter: Payer: Self-pay | Admitting: Family Medicine

## 2023-07-03 DIAGNOSIS — G4459 Other complicated headache syndrome: Secondary | ICD-10-CM | POA: Diagnosis not present

## 2023-07-03 DIAGNOSIS — E237 Disorder of pituitary gland, unspecified: Secondary | ICD-10-CM | POA: Diagnosis not present

## 2023-07-12 ENCOUNTER — Other Ambulatory Visit: Payer: Self-pay

## 2023-07-12 ENCOUNTER — Encounter: Payer: Self-pay | Admitting: Family Medicine

## 2023-07-12 MED ORDER — ROSUVASTATIN CALCIUM 20 MG PO TABS: 20.0000 mg | ORAL_TABLET | Freq: Every day | ORAL | 0 refills | Status: DC

## 2023-07-19 ENCOUNTER — Encounter: Payer: Self-pay | Admitting: Family Medicine

## 2023-07-20 ENCOUNTER — Ambulatory Visit: Payer: BC Managed Care – PPO | Admitting: Nurse Practitioner

## 2023-07-25 DIAGNOSIS — I951 Orthostatic hypotension: Secondary | ICD-10-CM | POA: Diagnosis not present

## 2023-07-25 DIAGNOSIS — G43911 Migraine, unspecified, intractable, with status migrainosus: Secondary | ICD-10-CM | POA: Diagnosis not present

## 2023-07-25 DIAGNOSIS — E236 Other disorders of pituitary gland: Secondary | ICD-10-CM | POA: Diagnosis not present

## 2023-08-10 NOTE — Progress Notes (Signed)
Name: Cynthia Woods   MRN: [id number]    DOB: Feb 18, 1971   Date:08/11/2023       Progress Note  Subjective  Chief Complaint  Annual Exam  HPI  Patient presents for annual CPE.  Diet: balanced diet  Exercise: discussed 150 minutes per week   Last Eye Exam: up to date  Last Dental Exam: up to date   [employer] Visit from 05/22/2023 in [facility]  AUDIT-C Score 0      Depression: Phq 9 is  negative    08/11/2023    1:45 PM 05/25/2023    2:28 PM 05/22/2023   10:52 AM 03/17/2023    2:47 PM 12/02/2022    2:33 PM  Depression screen PHQ 2/9  Decreased Interest 0 0 0 0 0  Down, Depressed, Hopeless 0 0 0 0 0  PHQ - 2 Score 0 0 0 0 0  Altered sleeping 1 0  0 0  Tired, decreased energy 1 0  0 0  Change in appetite 1 0  0 0  Feeling bad or failure about yourself  0 0  0 0  Trouble concentrating 0 0  0 0  Moving slowly or fidgety/restless 0 0  0 0  Suicidal thoughts 0 0  0 0  PHQ-9 Score 3 0  0 0  Difficult doing work/chores  Not difficult at all  Not difficult at all    Hypertension: BP Readings from Last 3 Encounters:  08/11/23 124/74  05/25/23 116/74  05/22/23 122/70   Obesity: Wt Readings from Last 3 Encounters:  08/11/23 166 lb (75.3 kg)  05/25/23 166 lb 6.4 oz (75.5 kg)  05/22/23 166 lb 8 oz (75.5 kg)   BMI Readings from Last 3 Encounters:  08/11/23 29.41 kg/m  05/25/23 28.56 kg/m  05/22/23 28.58 kg/m     Vaccines:   Tdap: up to date Shingrix: discussed starting when she turns 52 yo  Pneumonia: N/A  Flu: 2022, due but she refused  COVID-19: up to date   Hep C Screening: 09/28/18 STD testing and prevention (HIV/chl/gon/syphilis): 09/03/18 Intimate partner violence: negative screen  Sexual History : same partner for the past 5 years , she denies pain  Menstrual History/LMP/Abnormal Bleeding: cycles are irregular , lighter than usual now. Discussed peri menopause  Discussed importance of follow up if any  post-menopausal bleeding: yes  Incontinence Symptoms: negative for symptoms   Breast cancer:  - Last Mammogram: 01/12/23 - BRCA gene screening: N/A  Osteoporosis Prevention : Discussed high calcium and vitamin D supplementation, weight bearing exercises Bone density: N/A  Cervical cancer screening: 08/03/21  Skin cancer: Discussed monitoring for atypical lesions  Colorectal cancer: 11/27/19   Lung cancer:  Low Dose CT Chest recommended if Age 26-80 years, 20 pack-year currently smoking OR have quit w/in 15years. Patient does not qualify for screen   ECG: 08/03/20  Advanced Care Planning: A voluntary discussion about advance care planning including the explanation and discussion of advance directives.  Discussed health care proxy and Living will, and the patient was able to identify a health care proxy as son - Joaquin Music .  Patient does not have a living will and power of attorney of health care   Lipids: Lab Results  Component Value Date   CHOL 196 06/24/2022   CHOL 246 (H) 05/17/2021   CHOL 195 09/01/2020   Lab Results  Component Value Date   HDL 70 06/24/2022   HDL 61 05/17/2021  HDL 79 09/01/2020   Lab Results  Component Value Date   LDLCALC 112 (H) 06/24/2022   LDLCALC 154 (H) 05/17/2021   LDLCALC 96 09/01/2020   Lab Results  Component Value Date   TRIG 62 06/24/2022   TRIG 168 (H) 05/17/2021   TRIG 104 09/01/2020   Lab Results  Component Value Date   CHOLHDL 2.8 06/24/2022   CHOLHDL 4.0 05/17/2021   CHOLHDL 2.5 09/01/2020   No results found for: "LDLDIRECT"  Glucose: Glucose  Date Value Ref Range Status  07/01/2020 106 (H) 65 - 99 mg/dL Final  99/37/1696 789 (H) 65 - 99 mg/dL Final   Glucose, Bld  Date Value Ref Range Status  06/24/2022 85 65 - 99 mg/dL Final    Comment:    .            Fasting reference interval .   05/17/2021 87 65 - 99 mg/dL Final    Comment:    .            Fasting reference interval .     Patient Active Problem List    Diagnosis Date Noted   Empty sella (HCC) 08/02/2022   Vitamin D deficiency 06/24/2022   Back spasm 06/24/2022   Abnormal cervical Papanicolaou smear 06/22/2022   Abnormal Pap smear of cervix 08/03/21 LGSIL HPV+ 03/14/2022   Pure hypercholesterolemia 05/17/2021   Chronic tension-type headache, not intractable 07/03/2018   Migraine without aura and without status migrainosus, not intractable 07/03/2018   Cervical radiculopathy 07/20/2017   Benign breast cyst in female 09/09/2014    Past Surgical History:  Procedure Laterality Date   BREAST CYST ASPIRATION     COLONOSCOPY WITH PROPOFOL N/A 11/27/2019   Procedure: COLONOSCOPY WITH PROPOFOL;  Surgeon: Earline Mayotte, MD;  Location: [facility] ENDOSCOPY;  Service: Endoscopy;  Laterality: N/A;   FOOT SURGERY  12/06/2007   GYNECOLOGIC CRYOSURGERY  12/06/1991   WISDOM TOOTH EXTRACTION  12/05/2009    Family History  Problem Relation Age of Onset   Other Mother        brain tumor   Other Maternal Aunt        brain tumor   Other Maternal Grandmother        brain tumor   Cancer Maternal Grandmother    Diabetes Maternal Grandfather    Breast cancer Paternal Grandmother    Hypertension Paternal Grandmother    Cancer Paternal Grandfather        colon    Social History   Socioeconomic History   Marital status: Single    Spouse name: Not on file   Number of children: 3   Years of education: Not on file   Highest education level: Not on file  Occupational History    Comment: part   Tobacco Use   Smoking status: Never   Smokeless tobacco: Never  Vaping Use   Vaping status: Never Used  Substance and Sexual Activity   Alcohol use: Yes    Alcohol/week: 2.0 standard drinks of alcohol    Types: 2 Standard drinks or equivalent per week    Comment: last use 03/13/22 3x/mo   Drug use: No   Sexual activity: Yes    Partners: Male    Birth control/protection: None  Other Topics Concern   Not on file  Social History Narrative       Social Determinants of Health   Financial Resource Strain: Low Risk  (08/11/2023)   Overall Financial Resource Strain (CARDIA)  Difficulty of Paying Living Expenses: Not very hard  Food Insecurity: No Food Insecurity (08/11/2023)   Hunger Vital Sign    Worried About Running Out of Food in the Last Year: Never true    Ran Out of Food in the Last Year: Never true  Transportation Needs: No Transportation Needs (08/11/2023)   PRAPARE - Administrator, Civil Service (Medical): No    Lack of Transportation (Non-Medical): No  Physical Activity: Insufficiently Active (08/11/2023)   Exercise Vital Sign    Days of Exercise per Week: 2 days    Minutes of Exercise per Session: 30 min  Stress: No Stress Concern Present (08/11/2023)   [employer] of Occupational Health - Occupational Stress Questionnaire    Feeling of Stress : Only a little  Social Connections: Moderately Isolated (08/11/2023)   Social Connection and Isolation Panel [NHANES]    Frequency of Communication with Friends and Family: More than three times a week    Frequency of Social Gatherings with Friends and Family: More than three times a week    Attends Religious Services: More than 4 times per year    Active Member of [employer] or Organizations: No    Attends Banker Meetings: Never    Marital Status: Never married  Intimate Partner Violence: Not At Risk (08/11/2023)   Humiliation, Afraid, Rape, and Kick questionnaire    Fear of Current or Ex-Partner: No    Emotionally Abused: No    Physically Abused: No    Sexually Abused: No     Current Outpatient Medications:    Apple Cider Vinegar 188 MG CAPS, Take 1 capsule by mouth daily., Disp: , Rfl:    Beta Carotene (VITAMIN A) 25000 UNIT capsule, Take 25,000 Units by mouth daily., Disp: , Rfl:    Cholecalciferol (VITAMIN D) 50 MCG (2000 UT) CAPS, Take 1 capsule (2,000 Units total) by mouth daily., Disp: 100 capsule, Rfl: 3   cyanocobalamin (VITAMIN B12) 1000  MCG tablet, Take 1,000 mcg by mouth daily., Disp: , Rfl:    Multiple Vitamins-Minerals (WOMENS MULTIVITAMIN PO), Take by mouth., Disp: , Rfl:    rosuvastatin (CRESTOR) 20 MG tablet, Take 1 tablet (20 mg total) by mouth daily., Disp: 90 tablet, Rfl: 0   vitamin E 45 MG (100 UNITS) capsule, Take 100 Units by mouth daily., Disp: , Rfl:   Allergies  Allergen Reactions   Shellfish Allergy Anaphylaxis   Sulfa Antibiotics    Latex Rash   Naproxen Diarrhea, Nausea Only and Other (See Comments)   Oxycodone Nausea Only and Nausea And Vomiting   Tramadol Nausea Only and Nausea And Vomiting    nausea     ROS  Constitutional: Negative for fever or weight change.  Respiratory: Negative for cough and shortness of breath.   Cardiovascular: Negative for chest pain or palpitations.  Gastrointestinal: Negative for abdominal pain, no bowel changes.  Musculoskeletal: Negative for gait problem or joint swelling.  Skin: Negative for rash.  Neurological: Negative for dizziness or headache.  No other specific complaints in a complete review of systems (except as listed in HPI above).   Objective  Vitals:   08/11/23 1345  BP: 124/74  Pulse: 90  Resp: 16  SpO2: 98%  Weight: 166 lb (75.3 kg)  Height: 5\' 3"  (1.6 m)    Body mass index is 29.41 kg/m.  Physical Exam  Constitutional: Patient appears well-developed and well-nourished. No distress.  HENT: Head: Normocephalic and atraumatic. Ears: B TMs ok, no erythema  or effusion; Nose: Nose normal. Mouth/Throat: Oropharynx is clear and moist. No oropharyngeal exudate.  Eyes: Conjunctivae and EOM are normal. Pupils are equal, round, and reactive to light. No scleral icterus.  Neck: Normal range of motion. Neck supple. No JVD present. No thyromegaly present.  Cardiovascular: Normal rate, regular rhythm and normal heart sounds.  No murmur heard. No BLE edema. Pulmonary/Chest: Effort normal and breath sounds normal. No respiratory distress. Abdominal:  Soft. Bowel sounds are normal, no distension. There is no tenderness. no masses Breast: no lumps or masses, no nipple discharge or rashes FEMALE GENITALIA:  Not done  RECTAL: not done  Musculoskeletal: Normal range of motion, no joint effusions. No gross deformities Neurological: he is alert and oriented to person, place, and time. No cranial nerve deficit. Coordination, balance, strength, speech and gait are normal.  Skin: Skin is warm and dry. No rash noted. No erythema.  Psychiatric: Patient has a normal mood and affect. behavior is normal. Judgment and thought content normal.   Fall Risk:    08/11/2023    1:44 PM 05/25/2023    2:28 PM 05/22/2023   10:52 AM 03/17/2023    2:47 PM 12/22/2022    2:21 PM  Fall Risk   Falls in the past year? 1 0 1 0 0  Number falls in past yr: 0 0 0 0 0  Injury with Fall? 0 0 1 0 0  Risk for fall due to : No Fall Risks   No Fall Risks   Follow up Falls prevention discussed   Falls prevention discussed;Education provided;Falls evaluation completed      Functional Status Survey: Is the patient deaf or have difficulty hearing?: No Does the patient have difficulty seeing, even when wearing glasses/contacts?: No Does the patient have difficulty concentrating, remembering, or making decisions?: No Does the patient have difficulty walking or climbing stairs?: No Does the patient have difficulty dressing or bathing?: No Does the patient have difficulty doing errands alone such as visiting a doctor's office or shopping?: No   Assessment & Plan  1. Well adult exam  - Lipid panel - COMPLETE METABOLIC PANEL WITH GFR  2. Needs flu shot  refused  3. Pure hypercholesterolemia  - Lipid panel  4. Long-term use of high-risk medication  - COMPLETE METABOLIC PANEL WITH GFR   5. Breast cancer screening by mammogram  - MM 3D SCREENING MAMMOGRAM BILATERAL BREAST; Future    -USPSTF grade A and B recommendations reviewed with patient; age-appropriate  recommendations, preventive care, screening tests, etc discussed and encouraged; healthy living encouraged; see AVS for patient education given to patient -Discussed importance of 150 minutes of physical activity weekly, eat two servings of fish weekly, eat one serving of tree nuts ( cashews, pistachios, pecans, almonds.Marland Kitchen) every other day, eat 6 servings of fruit/vegetables daily and drink plenty of water and avoid sweet beverages.   -Reviewed Health Maintenance: Yes.

## 2023-08-11 ENCOUNTER — Encounter: Payer: Self-pay | Admitting: Family Medicine

## 2023-08-11 ENCOUNTER — Ambulatory Visit (INDEPENDENT_AMBULATORY_CARE_PROVIDER_SITE_OTHER): Payer: BC Managed Care – PPO | Admitting: Family Medicine

## 2023-08-11 VITALS — BP 124/74 | HR 90 | Resp 16 | Ht 63.0 in | Wt 166.0 lb

## 2023-08-11 DIAGNOSIS — Z79899 Other long term (current) drug therapy: Secondary | ICD-10-CM

## 2023-08-11 DIAGNOSIS — E7800 Pure hypercholesterolemia, unspecified: Secondary | ICD-10-CM

## 2023-08-11 DIAGNOSIS — Z23 Encounter for immunization: Secondary | ICD-10-CM | POA: Diagnosis not present

## 2023-08-11 DIAGNOSIS — Z0000 Encounter for general adult medical examination without abnormal findings: Secondary | ICD-10-CM | POA: Diagnosis not present

## 2023-08-11 DIAGNOSIS — Z1231 Encounter for screening mammogram for malignant neoplasm of breast: Secondary | ICD-10-CM

## 2023-08-12 LAB — COMPLETE METABOLIC PANEL WITH GFR
AG Ratio: 1.5 (calc) (ref 1.0–2.5)
ALT: 20 U/L (ref 6–29)
AST: 18 U/L (ref 10–35)
Albumin: 4.2 g/dL (ref 3.6–5.1)
Alkaline phosphatase (APISO): 54 U/L (ref 31–125)
BUN: 11 mg/dL (ref 7–25)
CO2: 28 mmol/L (ref 20–32)
Calcium: 9.4 mg/dL (ref 8.6–10.2)
Chloride: 105 mmol/L (ref 98–110)
Creat: 0.77 mg/dL (ref 0.50–0.99)
Globulin: 2.8 g/dL (ref 1.9–3.7)
Glucose, Bld: 89 mg/dL (ref 65–99)
Potassium: 4.5 mmol/L (ref 3.5–5.3)
Sodium: 139 mmol/L (ref 135–146)
Total Bilirubin: 0.5 mg/dL (ref 0.2–1.2)
Total Protein: 7 g/dL (ref 6.1–8.1)
eGFR: 95 mL/min/{1.73_m2} (ref >=60)

## 2023-08-12 LAB — LIPID PANEL
Cholesterol: 153 mg/dL (ref <200)
HDL: 64 mg/dL (ref >=50)
LDL Cholesterol (Calc): 76 mg/dL
Non-HDL Cholesterol (Calc): 89 mg/dL (ref <130)
Total CHOL/HDL Ratio: 2.4 (calc) (ref <5.0)
Triglycerides: 45 mg/dL (ref <150)

## 2023-08-16 ENCOUNTER — Encounter: Payer: Self-pay | Admitting: Family Medicine

## 2023-09-19 DIAGNOSIS — G44221 Chronic tension-type headache, intractable: Secondary | ICD-10-CM | POA: Diagnosis not present

## 2023-09-19 DIAGNOSIS — G4459 Other complicated headache syndrome: Secondary | ICD-10-CM | POA: Diagnosis not present

## 2023-10-03 DIAGNOSIS — F331 Major depressive disorder, recurrent, moderate: Secondary | ICD-10-CM | POA: Diagnosis not present

## 2023-10-11 DIAGNOSIS — Z113 Encounter for screening for infections with a predominantly sexual mode of transmission: Secondary | ICD-10-CM | POA: Diagnosis not present

## 2023-10-11 DIAGNOSIS — Z01419 Encounter for gynecological examination (general) (routine) without abnormal findings: Secondary | ICD-10-CM | POA: Diagnosis not present

## 2023-10-11 DIAGNOSIS — Z124 Encounter for screening for malignant neoplasm of cervix: Secondary | ICD-10-CM | POA: Diagnosis not present

## 2023-10-11 DIAGNOSIS — N760 Acute vaginitis: Secondary | ICD-10-CM | POA: Diagnosis not present

## 2023-10-11 DIAGNOSIS — Z6829 Body mass index (BMI) 29.0-29.9, adult: Secondary | ICD-10-CM | POA: Diagnosis not present

## 2023-10-12 DIAGNOSIS — H04123 Dry eye syndrome of bilateral lacrimal glands: Secondary | ICD-10-CM | POA: Diagnosis not present

## 2023-10-12 DIAGNOSIS — H02884 Meibomian gland dysfunction left upper eyelid: Secondary | ICD-10-CM | POA: Diagnosis not present

## 2023-10-19 ENCOUNTER — Ambulatory Visit: Payer: BC Managed Care – PPO | Admitting: Family Medicine

## 2023-10-19 NOTE — Progress Notes (Deleted)
Name: Cynthia Woods   MRN: [id number]    DOB: [birth year]   Date:10/19/2023       Progress Note  Subjective  Chief Complaint  Follow Up/ Headaches  HPI  Empty Sella and headaches: she had a normal fundal exam done by ophthalmologist, seen by neurologist and advised to start topamax and take Maxalt prn. She did not start Topamax , she was afraid of side effects. She states headaches are described as dull/aching or throbbing, intense when present 10/10 , not associated with nausea or sound or light sensitivy, she states episodes are less often with stress control and staying hydrated, sleeping better. She takes if she takes Bayer headache pills and symptoms resolves within one hour She lost to follow up with neurologist but will re-schedule   Dyslipidemia: she is taking statin therapy, last level improved with medication   Right knee pain: about one week ago she noticed pain with flexion of knee, no pain while walking , denies effusion , trauma or increase in warmth. She has not tried naproxen that she has at home.   Vitamin D deficiency: continue supplementation  Patient Active Problem List   Diagnosis Date Noted   Empty sella (HCC) 08/02/2022   Vitamin D deficiency 06/24/2022   Back spasm 06/24/2022   Abnormal cervical Papanicolaou smear 06/22/2022   Abnormal Pap smear of cervix 08/03/21 LGSIL HPV+ 03/14/2022   Pure hypercholesterolemia 05/17/2021   Chronic tension-type headache, not intractable 07/03/2018   Migraine without aura and without status migrainosus, not intractable 07/03/2018   Cervical radiculopathy 07/20/2017   Benign breast cyst in female 09/09/2014    Past Surgical History:  Procedure Laterality Date   BREAST CYST ASPIRATION     COLONOSCOPY WITH PROPOFOL N/A 11/27/2019   Procedure: COLONOSCOPY WITH PROPOFOL;  Surgeon: Earline Mayotte, MD;  Location: [facility] ENDOSCOPY;  Service: Endoscopy;  Laterality: N/A;   FOOT SURGERY  12/06/2007   GYNECOLOGIC  CRYOSURGERY  12/06/1991   WISDOM TOOTH EXTRACTION  12/05/2009    Family History  Problem Relation Age of Onset   Other Mother        brain tumor   Other Maternal Aunt        brain tumor   Other Maternal Grandmother        brain tumor   Cancer Maternal Grandmother    Diabetes Maternal Grandfather    Breast cancer Paternal Grandmother    Hypertension Paternal Grandmother    Cancer Paternal Grandfather        colon    Social History   Tobacco Use   Smoking status: Never   Smokeless tobacco: Never  Substance Use Topics   Alcohol use: Yes    Alcohol/week: 2.0 standard drinks of alcohol    Types: 2 Standard drinks or equivalent per week    Comment: last use 03/13/22 3x/mo     Current Outpatient Medications:    Apple Cider Vinegar 188 MG CAPS, Take 1 capsule by mouth daily., Disp: , Rfl:    Beta Carotene (VITAMIN A) 25000 UNIT capsule, Take 25,000 Units by mouth daily., Disp: , Rfl:    Cholecalciferol (VITAMIN D) 50 MCG (2000 UT) CAPS, Take 1 capsule (2,000 Units total) by mouth daily., Disp: 100 capsule, Rfl: 3   cyanocobalamin (VITAMIN B12) 1000 MCG tablet, Take 1,000 mcg by mouth daily., Disp: , Rfl:    Multiple Vitamins-Minerals (WOMENS MULTIVITAMIN PO), Take by mouth., Disp: , Rfl:    rosuvastatin (CRESTOR) 20 MG tablet, Take 1 tablet (20  mg total) by mouth daily., Disp: 90 tablet, Rfl: 0   vitamin E 45 MG (100 UNITS) capsule, Take 100 Units by mouth daily., Disp: , Rfl:   Allergies  Allergen Reactions   Shellfish Allergy Anaphylaxis   Sulfa Antibiotics    Latex Rash   Naproxen Diarrhea, Nausea Only and Other (See Comments)   Oxycodone Nausea Only and Nausea And Vomiting   Tramadol Nausea Only and Nausea And Vomiting    nausea    I personally reviewed active problem list, medication list, allergies, family history, social history, health maintenance with the patient/caregiver today.   ROS  ***  Objective  There were no vitals filed for this visit.  There is  no height or weight on file to calculate BMI.  Physical Exam ***   PHQ2/9:    08/11/2023    1:45 PM 05/25/2023    2:28 PM 05/22/2023   10:52 AM 03/17/2023    2:47 PM 12/02/2022    2:33 PM  Depression screen PHQ 2/9  Decreased Interest 0 0 0 0 0  Down, Depressed, Hopeless 0 0 0 0 0  PHQ - 2 Score 0 0 0 0 0  Altered sleeping 1 0  0 0  Tired, decreased energy 1 0  0 0  Change in appetite 1 0  0 0  Feeling bad or failure about yourself  0 0  0 0  Trouble concentrating 0 0  0 0  Moving slowly or fidgety/restless 0 0  0 0  Suicidal thoughts 0 0  0 0  PHQ-9 Score 3 0  0 0  Difficult doing work/chores  Not difficult at all  Not difficult at all     phq 9 is {gen pos [id number]}   Fall Risk:    08/11/2023    1:44 PM 05/25/2023    2:28 PM 05/22/2023   10:52 AM 03/17/2023    2:47 PM 12/22/2022    2:21 PM  Fall Risk   Falls in the past year? 1 0 1 0 0  Number falls in past yr: 0 0 0 0 0  Injury with Fall? 0 0 1 0 0  Risk for fall due to : No Fall Risks   No Fall Risks   Follow up Falls prevention discussed   Falls prevention discussed;Education provided;Falls evaluation completed       Functional Status Survey:      Assessment & Plan  *** There are no diagnoses linked to this encounter.

## 2023-10-20 ENCOUNTER — Ambulatory Visit: Payer: BC Managed Care – PPO | Admitting: Family Medicine

## 2023-10-20 DIAGNOSIS — Z23 Encounter for immunization: Secondary | ICD-10-CM

## 2023-10-21 ENCOUNTER — Encounter: Payer: Self-pay | Admitting: Family Medicine

## 2023-10-24 NOTE — Progress Notes (Unsigned)
Name: Cynthia Woods   MRN: [id number]    DOB: [birth year]   Date:10/25/2023       Progress Note  Subjective  Chief Complaint  Follow Up  HPI  Dysthymia: she states not feeling well for months, however she has a problem with her lead and once she reached out to HR she has been feeling targeted. She tried switching positions but not given the option. 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 She is feeling trapped . She is crying, lack of motivation feeling sad. Started to feel hopeless  Empty Sella and headaches: she had a normal fundal exam done by ophthalmologist, seen by neurologist and advised to start topamax and take Maxalt prn. She did not start Topamax due to fear of side effects. She states headaches are described as dull/aching or throbbing, intense when present 10/10 , not associated with nausea or sound or light sensitivity. Stress is higher again and headaches are more often now, currently 1-4 times per week. She had a visit with her neurologist in October and was given Gabapentin 100 mg but she has not started it yet   Dyslipidemia: she is taking statin therapy, last level improved with medication, she needs a refill   Vitamin D deficiency: continue supplementation, we will recheck next year   Patient Active Problem List   Diagnosis Date Noted   Empty sella (HCC) 08/02/2022   Vitamin D deficiency 06/24/2022   Back spasm 06/24/2022   Abnormal cervical Papanicolaou smear 06/22/2022   Abnormal Pap smear of cervix 08/03/21 LGSIL HPV+ 03/14/2022   Pure hypercholesterolemia 05/17/2021   Chronic tension-type headache, not intractable 07/03/2018   Migraine without aura and without status migrainosus, not intractable 07/03/2018   Cervical radiculopathy 07/20/2017   Benign breast cyst in female 09/09/2014    Past Surgical History:  Procedure Laterality Date   BREAST CYST ASPIRATION     COLONOSCOPY  WITH PROPOFOL N/A 11/27/2019   Procedure: COLONOSCOPY WITH PROPOFOL;  Surgeon: Earline Mayotte, MD;  Location: [facility];  Service: Endoscopy;  Laterality: N/A;   FOOT SURGERY  12/06/2007   GYNECOLOGIC CRYOSURGERY  12/06/1991   WISDOM TOOTH EXTRACTION  12/05/2009    Family History  Problem Relation Age of Onset   Other Mother        brain tumor   Other Maternal Aunt        brain tumor   Other Maternal Grandmother        brain tumor   Cancer Maternal Grandmother    Diabetes Maternal Grandfather    Breast cancer Paternal Grandmother    Hypertension Paternal Grandmother    Cancer Paternal Grandfather        colon    Social History   Tobacco Use   Smoking status: Never   Smokeless tobacco: Never  Substance Use Topics   Alcohol use: Yes    Alcohol/week: 2.0 standard drinks of alcohol    Types: 2 Standard drinks or equivalent per week    Comment: last use 03/13/22 3x/mo     Current Outpatient Medications:    Apple Cider Vinegar 188 MG CAPS, Take 1 capsule by mouth daily., Disp: , Rfl:    Beta Carotene (VITAMIN A) 25000 UNIT capsule, Take 25,000 Units by mouth daily., Disp: , Rfl:    Cholecalciferol (VITAMIN D) 50 MCG (2000 UT) CAPS, Take 1 capsule (2,000 Units total) by mouth daily.,  Disp: 100 capsule, Rfl: 3   cyanocobalamin (VITAMIN B12) 1000 MCG tablet, Take 1,000 mcg by mouth daily., Disp: , Rfl:    escitalopram (LEXAPRO) 10 MG tablet, Take 1 tablet (10 mg total) by mouth daily., Disp: 30 tablet, Rfl: 1   gabapentin (NEURONTIN) 100 MG capsule, Take 100 mg by mouth 3 (three) times daily., Disp: , Rfl:    Multiple Vitamins-Minerals (WOMENS MULTIVITAMIN PO), Take by mouth., Disp: , Rfl:    vitamin E 45 MG (100 UNITS) capsule, Take 100 Units by mouth daily., Disp: , Rfl:    rosuvastatin (CRESTOR) 20 MG tablet, Take 1 tablet (20 mg total) by mouth daily., Disp: 90 tablet, Rfl: 1  Allergies  Allergen Reactions   Shellfish Allergy Anaphylaxis   Sulfa Antibiotics     Latex Rash   Naproxen Diarrhea, Nausea Only and Other (See Comments)   Oxycodone Nausea Only and Nausea And Vomiting   Tramadol Nausea Only and Nausea And Vomiting    nausea    I personally reviewed active problem list, medication list, allergies, family history, social history, health maintenance with the patient/caregiver today.   ROS  Ten systems reviewed and is negative except as mentioned in HPI    Objective  Vitals:   10/25/23 1414  BP: 120/72  Pulse: 91  Resp: 16  SpO2: 97%  Weight: 165 lb (74.8 kg)  Height: 5\' 4"  (1.626 m)    Body mass index is 28.32 kg/m.  Physical Exam  Constitutional: Patient appears well-developed and well-nourished.  No distress.  HEENT: head atraumatic, normocephalic, pupils equal and reactive to light, neck supple Cardiovascular: Normal rate, regular rhythm and normal heart sounds.  No murmur heard. No BLE edema. Pulmonary/Chest: Effort normal and breath sounds normal. No respiratory distress. Abdominal: Soft.  There is no tenderness. Psychiatric: Patient has a normal mood and affect. behavior is normal. Judgment and thought content normal.    PHQ2/9:    10/25/2023    2:14 PM 08/11/2023    1:45 PM 05/25/2023    2:28 PM 05/22/2023   10:52 AM 03/17/2023    2:47 PM  Depression screen PHQ 2/9  Decreased Interest 1 0 0 0 0  Down, Depressed, Hopeless 1 0 0 0 0  PHQ - 2 Score 2 0 0 0 0  Altered sleeping 3 1 0  0  Tired, decreased energy 0 1 0  0  Change in appetite 0 1 0  0  Feeling bad or failure about yourself  2 0 0  0  Trouble concentrating 0 0 0  0  Moving slowly or fidgety/restless 0 0 0  0  Suicidal thoughts 0 0 0  0  PHQ-9 Score 7 3 0  0  Difficult doing work/chores   Not difficult at all  Not difficult at all    phq 9 is positive   Fall Risk:    10/25/2023    2:14 PM 08/11/2023    1:44 PM 05/25/2023    2:28 PM 05/22/2023   10:52 AM 03/17/2023    2:47 PM  Fall Risk   Falls in the past year? 0 1 0 1 0  Number falls in  past yr: 0 0 0 0 0  Injury with Fall? 0 0 0 1 0  Risk for fall due to : No Fall Risks No Fall Risks   No Fall Risks  Follow up Falls prevention discussed Falls prevention discussed   Falls prevention discussed;Education provided;Falls evaluation completed  Functional Status Survey: Is the patient deaf or have difficulty hearing?: No Does the patient have difficulty seeing, even when wearing glasses/contacts?: Yes Does the patient have difficulty concentrating, remembering, or making decisions?: Yes Does the patient have difficulty walking or climbing stairs?: No Does the patient have difficulty dressing or bathing?: No Does the patient have difficulty doing errands alone such as visiting a doctor's office or shopping?: No    Assessment & Plan  1. Dysthymia  - escitalopram (LEXAPRO) 10 MG tablet; Take 1 tablet (10 mg total) by mouth daily.  Dispense: 30 tablet; Refill: 1  2. Empty sella (HCC)  Keep follow up with neurologist and start gabapentin   3. Vitamin D deficiency  Continue supplementation   4. Pure hypercholesterolemia  - rosuvastatin (CRESTOR) 20 MG tablet; Take 1 tablet (20 mg total) by mouth daily.  Dispense: 90 tablet; Refill: 1

## 2023-10-25 ENCOUNTER — Ambulatory Visit (INDEPENDENT_AMBULATORY_CARE_PROVIDER_SITE_OTHER): Payer: BC Managed Care – PPO | Admitting: Family Medicine

## 2023-10-25 ENCOUNTER — Encounter: Payer: Self-pay | Admitting: Family Medicine

## 2023-10-25 VITALS — BP 120/72 | HR 91 | Resp 16 | Ht 64.0 in | Wt 165.0 lb

## 2023-10-25 DIAGNOSIS — E559 Vitamin D deficiency, unspecified: Secondary | ICD-10-CM

## 2023-10-25 DIAGNOSIS — F341 Dysthymic disorder: Secondary | ICD-10-CM

## 2023-10-25 DIAGNOSIS — E7800 Pure hypercholesterolemia, unspecified: Secondary | ICD-10-CM | POA: Diagnosis not present

## 2023-10-25 DIAGNOSIS — E236 Other disorders of pituitary gland: Secondary | ICD-10-CM | POA: Diagnosis not present

## 2023-10-25 DIAGNOSIS — Z23 Encounter for immunization: Secondary | ICD-10-CM

## 2023-10-25 MED ORDER — ESCITALOPRAM OXALATE 10 MG PO TABS: 10.0000 mg | ORAL_TABLET | Freq: Every day | ORAL | 1 refills | Status: DC

## 2023-10-25 MED ORDER — ROSUVASTATIN CALCIUM 20 MG PO TABS: 20.0000 mg | ORAL_TABLET | Freq: Every day | ORAL | 1 refills | Status: DC

## 2023-11-07 NOTE — Progress Notes (Unsigned)
Name: Cynthia Woods   MRN: [id number]    DOB: 11/11/1971   Date:11/08/2023       Progress Note  Subjective  Chief Complaint  Chief Complaint  Patient presents with   Hand Pain    Finger pain w/ swelling went to urgent care 2 days ago given pain med and antibiotic doxy which she can not tolerate due to stomach so stopped taking.    HPI  Discussed the use of AI scribe software for clinical note transcription with the patient, who gave verbal consent to proceed.  History of Present Illness   The patient presents with a chief complaint of a significantly swollen and painful right index finger. The discomfort began on a Thursday, initially attributed to physical activity related to Thanksgiving preparations. By Friday, the patient noticed a small red area around a longstanding white spot, previously identified as a wart, on the right index finger. The pain and swelling progressively worsened over the weekend, with the entire finger becoming involved and the pain level escalating beyond a ten. The patient describes the pain as throbbing, radiating, and so severe it disrupted sleep.  The patient sought care at an urgent care clinic on Monday, where an infection was suspected. The patient was prescribed doxycycline, tramadol for pain, promethazine for nausea associated with tramadol, and an antibiotic ointment. The patient reported significant nausea after taking doxycycline, even when taken with food, and subsequently discontinued the medication. Despite this, there was some improvement in the finger's condition with the initiated treatment.  The patient's pain is currently rated between six and seven, primarily triggered by contact. The patient describes the finger as feeling tight, akin to a balloon ready to pop. The primary source of discomfort is localized to the area of the white spot. The patient's work, which involves typing, is significantly hindered by the condition of the finger.          Patient Active Problem List   Diagnosis Date Noted   Empty sella (HCC) 08/02/2022   Vitamin D deficiency 06/24/2022   Back spasm 06/24/2022   Abnormal cervical Papanicolaou smear 06/22/2022   Abnormal Pap smear of cervix 08/03/21 LGSIL HPV+ 03/14/2022   Pure hypercholesterolemia 05/17/2021   Chronic tension-type headache, not intractable 07/03/2018   Migraine without aura and without status migrainosus, not intractable 07/03/2018   Cervical radiculopathy 07/20/2017   Benign breast cyst in female 09/09/2014    Past Surgical History:  Procedure Laterality Date   BREAST CYST ASPIRATION     COLONOSCOPY WITH PROPOFOL N/A 11/27/2019   Procedure: COLONOSCOPY WITH PROPOFOL;  Surgeon: Earline Mayotte, MD;  Location: [facility];  Service: Endoscopy;  Laterality: N/A;   FOOT SURGERY  12/06/2007   GYNECOLOGIC CRYOSURGERY  12/06/1991   WISDOM TOOTH EXTRACTION  12/05/2009    Family History  Problem Relation Age of Onset   Other Mother        brain tumor   Other Maternal Aunt        brain tumor   Other Maternal Grandmother        brain tumor   Cancer Maternal Grandmother    Diabetes Maternal Grandfather    Breast cancer Paternal Grandmother    Hypertension Paternal Grandmother    Cancer Paternal Grandfather        colon    Social History   Tobacco Use   Smoking status: Never   Smokeless tobacco: Never  Substance Use Topics   Alcohol use: Yes    Alcohol/week: 2.0  standard drinks of alcohol    Types: 2 Standard drinks or equivalent per week    Comment: last use 03/13/22 3x/mo     Current Outpatient Medications:    amoxicillin-clavulanate (AUGMENTIN) 875-125 MG tablet, Take 1 tablet by mouth 2 (two) times daily., Disp: 14 tablet, Rfl: 0   Apple Cider Vinegar 188 MG CAPS, Take 1 capsule by mouth daily., Disp: , Rfl:    Beta Carotene (VITAMIN A) 25000 UNIT capsule, Take 25,000 Units by mouth daily., Disp: , Rfl:    Cholecalciferol (VITAMIN D) 50 MCG (2000 UT) CAPS,  Take 1 capsule (2,000 Units total) by mouth daily., Disp: 100 capsule, Rfl: 3   clotrimazole (CLOTRIMAZOLE ANTI-FUNGAL) 1 % cream, Apply 1 Application topically 2 (two) times daily., Disp: 30 g, Rfl: 0   cyanocobalamin (VITAMIN B12) 1000 MCG tablet, Take 1,000 mcg by mouth daily., Disp: , Rfl:    escitalopram (LEXAPRO) 10 MG tablet, Take 1 tablet (10 mg total) by mouth daily., Disp: 30 tablet, Rfl: 1   gabapentin (NEURONTIN) 100 MG capsule, Take 100 mg by mouth 3 (three) times daily., Disp: , Rfl:    Multiple Vitamins-Minerals (WOMENS MULTIVITAMIN PO), Take by mouth., Disp: , Rfl:    rosuvastatin (CRESTOR) 20 MG tablet, Take 1 tablet (20 mg total) by mouth daily., Disp: 90 tablet, Rfl: 1   vitamin E 45 MG (100 UNITS) capsule, Take 100 Units by mouth daily., Disp: , Rfl:   Allergies  Allergen Reactions   Shellfish Allergy Anaphylaxis   Doxycycline Nausea Only   Sulfa Antibiotics    Latex Rash   Naproxen Diarrhea, Nausea Only and Other (See Comments)   Oxycodone Nausea Only and Nausea And Vomiting   Tramadol Nausea Only and Nausea And Vomiting    nausea    I personally reviewed active problem list, medication list, allergies, family history with the patient/caregiver today.   ROS  Ten systems reviewed and is negative except as mentioned in HPI    Objective  Vitals:   11/08/23 0823  BP: 116/78  Pulse: (!) 108  Resp: 18  Temp: 98.2 F (36.8 C)  TempSrc: Oral  SpO2: 98%  Weight: 166 lb 8 oz (75.5 kg)  Height: 5\' 3"  (1.6 m)    Body mass index is 29.49 kg/m.  Physical Exam  Constitutional: Patient appears well-developed and well-nourished.  No distress.  HEENT: head atraumatic, normocephalic, pupils equal and reactive to light, neck supple, Cardiovascular: Normal rate, regular rhythm and normal heart sounds.  No murmur heard. No BLE edema. Pulmonary/Chest: Effort normal and breath sounds normal. No respiratory distress. Muscular skeletal: swelling, pain to palpation of  distal finger medially. Mild erythema. See pictures  Abdominal: Soft.  There is no tenderness. Psychiatric: Patient has a normal mood and affect. behavior is normal. Judgment and thought content normal.   Recent Results (from the past 2160 hour(s))  Lipid panel     Status: None   Collection Time: 08/11/23  3:01 PM  Result Value Ref Range   Cholesterol 153 <200 mg/dL   HDL 64 > OR = 50 mg/dL   Triglycerides 45 <161 mg/dL   LDL Cholesterol (Calc) 76 mg/dL (calc)    Comment: Reference range: <100 . Desirable range <100 mg/dL for primary prevention;   <70 mg/dL for patients with CHD or diabetic patients  with > or = 2 CHD risk factors. Marland Kitchen LDL-C is now calculated using the Martin-Hopkins  calculation, which is a validated novel method providing  better accuracy than the  Friedewald equation in the  estimation of LDL-C.  Horald Pollen et al. Lenox Ahr. 6962;952(84): 2061-2068  (http://education.[facility].com/faq/FAQ164)    Total CHOL/HDL Ratio 2.4 <5.0 (calc)   Non-HDL Cholesterol (Calc) 89 <132 mg/dL (calc)    Comment: For patients with diabetes plus 1 major ASCVD risk  factor, treating to a non-HDL-C goal of <100 mg/dL  (LDL-C of <44 mg/dL) is considered a therapeutic  option.   COMPLETE METABOLIC PANEL WITH GFR     Status: None   Collection Time: 08/11/23  3:01 PM  Result Value Ref Range   Glucose, Bld 89 65 - 99 mg/dL    Comment: .            Fasting reference interval .    BUN 11 7 - 25 mg/dL   Creat 0.10 2.72 - 5.36 mg/dL   eGFR 95 > OR = 60 UY/QIH/4.74Q5   BUN/Creatinine Ratio SEE NOTE: 6 - 22 (calc)    Comment:    Not Reported: BUN and Creatinine are within    reference range. .    Sodium 139 135 - 146 mmol/L   Potassium 4.5 3.5 - 5.3 mmol/L   Chloride 105 98 - 110 mmol/L   CO2 28 20 - 32 mmol/L   Calcium 9.4 8.6 - 10.2 mg/dL   Total Protein 7.0 6.1 - 8.1 g/dL   Albumin 4.2 3.6 - 5.1 g/dL   Globulin 2.8 1.9 - 3.7 g/dL (calc)   AG Ratio 1.5 1.0 - 2.5 (calc)    Total Bilirubin 0.5 0.2 - 1.2 mg/dL   Alkaline phosphatase (APISO) 54 31 - 125 U/L   AST 18 10 - 35 U/L   ALT 20 6 - 29 U/L     PHQ2/9:    11/08/2023    8:49 AM 11/08/2023    8:29 AM 10/25/2023    2:14 PM 08/11/2023    1:45 PM 05/25/2023    2:28 PM  Depression screen PHQ 2/9  Decreased Interest 0 0 1 0 0  Down, Depressed, Hopeless 0 0 1 0 0  PHQ - 2 Score 0 0 2 0 0  Altered sleeping 1 0 3 1 0  Tired, decreased energy 0 0 0 1 0  Change in appetite 0 0 0 1 0  Feeling bad or failure about yourself  0 0 2 0 0  Trouble concentrating 0 0 0 0 0  Moving slowly or fidgety/restless 0 0 0 0 0  Suicidal thoughts 0 0 0 0 0  PHQ-9 Score 1 0 7 3 0  Difficult doing work/chores Not difficult at all Not difficult at all   Not difficult at all    phq 9 is positive   Fall Risk:    11/08/2023    8:29 AM 10/25/2023    2:14 PM 08/11/2023    1:44 PM 05/25/2023    2:28 PM 05/22/2023   10:52 AM  Fall Risk   Falls in the past year? 0 0 1 0 1  Number falls in past yr: 0 0 0 0 0  Injury with Fall? 0 0 0 0 1  Risk for fall due to : No Fall Risks No Fall Risks No Fall Risks    Follow up Falls prevention discussed;Education provided;Falls evaluation completed Falls prevention discussed Falls prevention discussed      Assessment & Plan  Assessment and Plan    Finger Infection Acute onset of pain, swelling, and redness in the right index finger. Initial treatment with doxycycline was not  tolerated due to nausea. Improvement noted with antibiotic ointment and Epsom salt soaks. -Discontinue doxycycline due to intolerance. -Start Augmentin twice daily for 7 days. -Continue antibiotic ointment and Epsom salt soaks. -Referral to [facility] for evaluation by a hand specialist.  Pain Management Severe pain in the right index finger, rated between 6 and 7, exacerbated by contact. -Continue tramadol as needed for pain. -Use Tylenol for additional pain control if needed.  Depression No current symptoms  of depression reported. -No changes to current management.

## 2023-11-08 ENCOUNTER — Ambulatory Visit (INDEPENDENT_AMBULATORY_CARE_PROVIDER_SITE_OTHER): Payer: BC Managed Care – PPO | Admitting: Family Medicine

## 2023-11-08 ENCOUNTER — Encounter: Payer: Self-pay | Admitting: Family Medicine

## 2023-11-08 VITALS — BP 116/78 | HR 108 | Temp 98.2°F | Resp 18 | Ht 63.0 in | Wt 166.5 lb

## 2023-11-08 DIAGNOSIS — M7989 Other specified soft tissue disorders: Secondary | ICD-10-CM | POA: Diagnosis not present

## 2023-11-08 DIAGNOSIS — M79644 Pain in right finger(s): Secondary | ICD-10-CM

## 2023-11-08 MED ORDER — CLOTRIMAZOLE 1 % EX CREA: 1.0000 | TOPICAL_CREAM | Freq: Two times a day (BID) | CUTANEOUS | 0 refills | Status: AC

## 2023-11-08 MED ORDER — AMOXICILLIN-POT CLAVULANATE 875-125 MG PO TABS: 1.0000 | ORAL_TABLET | Freq: Two times a day (BID) | ORAL | 0 refills | Status: DC

## 2023-11-13 ENCOUNTER — Ambulatory Visit: Payer: BC Managed Care – PPO | Admitting: Family Medicine

## 2023-11-30 ENCOUNTER — Encounter: Payer: Self-pay | Admitting: Internal Medicine

## 2023-12-04 DIAGNOSIS — M5413 Radiculopathy, cervicothoracic region: Secondary | ICD-10-CM | POA: Diagnosis not present

## 2023-12-04 DIAGNOSIS — M546 Pain in thoracic spine: Secondary | ICD-10-CM | POA: Diagnosis not present

## 2023-12-04 DIAGNOSIS — M9901 Segmental and somatic dysfunction of cervical region: Secondary | ICD-10-CM | POA: Diagnosis not present

## 2023-12-04 DIAGNOSIS — M9902 Segmental and somatic dysfunction of thoracic region: Secondary | ICD-10-CM | POA: Diagnosis not present

## 2023-12-07 ENCOUNTER — Ambulatory Visit: Payer: BC Managed Care – PPO | Admitting: Internal Medicine

## 2023-12-07 DIAGNOSIS — M5413 Radiculopathy, cervicothoracic region: Secondary | ICD-10-CM | POA: Diagnosis not present

## 2023-12-07 DIAGNOSIS — M546 Pain in thoracic spine: Secondary | ICD-10-CM | POA: Diagnosis not present

## 2023-12-07 DIAGNOSIS — M9902 Segmental and somatic dysfunction of thoracic region: Secondary | ICD-10-CM | POA: Diagnosis not present

## 2023-12-07 DIAGNOSIS — M9901 Segmental and somatic dysfunction of cervical region: Secondary | ICD-10-CM | POA: Diagnosis not present

## 2024-01-02 DIAGNOSIS — M5413 Radiculopathy, cervicothoracic region: Secondary | ICD-10-CM | POA: Diagnosis not present

## 2024-01-02 DIAGNOSIS — M546 Pain in thoracic spine: Secondary | ICD-10-CM | POA: Diagnosis not present

## 2024-01-02 DIAGNOSIS — M9902 Segmental and somatic dysfunction of thoracic region: Secondary | ICD-10-CM | POA: Diagnosis not present

## 2024-01-02 DIAGNOSIS — M9901 Segmental and somatic dysfunction of cervical region: Secondary | ICD-10-CM | POA: Diagnosis not present

## 2024-01-06 DIAGNOSIS — A499 Bacterial infection, unspecified: Secondary | ICD-10-CM | POA: Diagnosis not present

## 2024-01-06 DIAGNOSIS — B3731 Acute candidiasis of vulva and vagina: Secondary | ICD-10-CM | POA: Diagnosis not present

## 2024-01-06 DIAGNOSIS — R350 Frequency of micturition: Secondary | ICD-10-CM | POA: Diagnosis not present

## 2024-01-06 DIAGNOSIS — N390 Urinary tract infection, site not specified: Secondary | ICD-10-CM | POA: Diagnosis not present

## 2024-01-12 ENCOUNTER — Encounter: Payer: Self-pay | Admitting: Family Medicine

## 2024-01-12 ENCOUNTER — Ambulatory Visit: Payer: BC Managed Care – PPO | Admitting: Family Medicine

## 2024-01-12 VITALS — BP 116/74 | HR 99 | Resp 16 | Ht 63.0 in | Wt 165.9 lb

## 2024-01-12 DIAGNOSIS — F341 Dysthymic disorder: Secondary | ICD-10-CM | POA: Diagnosis not present

## 2024-01-12 DIAGNOSIS — F5102 Adjustment insomnia: Secondary | ICD-10-CM | POA: Diagnosis not present

## 2024-01-12 DIAGNOSIS — G44229 Chronic tension-type headache, not intractable: Secondary | ICD-10-CM | POA: Diagnosis not present

## 2024-01-12 MED ORDER — TRAZODONE HCL 50 MG PO TABS: 25.0000 mg | ORAL_TABLET | Freq: Every evening | ORAL | 0 refills | Status: DC | PRN

## 2024-01-12 NOTE — Progress Notes (Deleted)
 Name: Cynthia Woods   MRN: [id number]    DOB: 04/21/1971   Date:01/12/2024       Progress Note  Subjective  Chief Complaint  Chief Complaint  Patient presents with   Headache   Stress    Onset for 2 weeks over work    {HPI:32069}  Patient Active Problem List   Diagnosis Date Noted   Empty sella (HCC) 08/02/2022   Vitamin D  deficiency 06/24/2022   Back spasm 06/24/2022   Abnormal cervical Papanicolaou smear 06/22/2022   Abnormal Pap smear of cervix 08/03/21 LGSIL HPV+ 03/14/2022   Pure hypercholesterolemia 05/17/2021   Chronic tension-type headache, not intractable 07/03/2018   Migraine without aura and without status migrainosus, not intractable 07/03/2018   Cervical radiculopathy 07/20/2017   Benign breast cyst in female 09/09/2014    Social History   Tobacco Use   Smoking status: Never   Smokeless tobacco: Never  Substance Use Topics   Alcohol use: Yes    Alcohol/week: 2.0 standard drinks of alcohol    Types: 2 Standard drinks or equivalent per week    Comment: last use 03/13/22 3x/mo     Current Outpatient Medications:    Apple Cider Vinegar 188 MG CAPS, Take 1 capsule by mouth daily., Disp: , Rfl:    Beta Carotene (VITAMIN A) 25000 UNIT capsule, Take 25,000 Units by mouth daily., Disp: , Rfl:    Cholecalciferol (VITAMIN D ) 50 MCG (2000 UT) CAPS, Take 1 capsule (2,000 Units total) by mouth daily., Disp: 100 capsule, Rfl: 3   ciprofloxacin  (CIPRO ) 250 MG tablet, Take by mouth., Disp: , Rfl:    clotrimazole  (CLOTRIMAZOLE  ANTI-FUNGAL) 1 % cream, Apply 1 Application topically 2 (two) times daily., Disp: 30 g, Rfl: 0   cyanocobalamin (VITAMIN B12) 1000 MCG tablet, Take 1,000 mcg by mouth daily., Disp: , Rfl:    escitalopram  (LEXAPRO ) 10 MG tablet, Take 1 tablet (10 mg total) by mouth daily., Disp: 30 tablet, Rfl: 1   gabapentin (NEURONTIN) 100 MG capsule, Take 100 mg by mouth 3 (three) times daily., Disp: , Rfl:    Multiple Vitamins-Minerals (WOMENS MULTIVITAMIN  PO), Take by mouth., Disp: , Rfl:    rosuvastatin  (CRESTOR ) 20 MG tablet, Take 1 tablet (20 mg total) by mouth daily., Disp: 90 tablet, Rfl: 1   vitamin E 45 MG (100 UNITS) capsule, Take 100 Units by mouth daily., Disp: , Rfl:    amoxicillin -clavulanate (AUGMENTIN ) 875-125 MG tablet, Take 1 tablet by mouth 2 (two) times daily. (Patient not taking: Reported on 01/12/2024), Disp: 14 tablet, Rfl: 0  Allergies  Allergen Reactions   Shellfish Allergy Anaphylaxis   Doxycycline Nausea Only   Sulfa Antibiotics    Latex Rash   Naproxen  Diarrhea, Nausea Only and Other (See Comments)   Oxycodone  Nausea Only and Nausea And Vomiting   Tramadol Nausea Only and Nausea And Vomiting    nausea    ROS  ***  Objective  Vitals:   01/12/24 1347  BP: 116/74  Pulse: 99  Resp: 16  SpO2: 98%  Weight: 165 lb 14.4 oz (75.3 kg)  Height: 5' 3 (1.6 m)    Body mass index is 29.39 kg/m.    Physical Exam  ***  No results found for this or any previous visit (from the past 2160 hours).   {A&P:32071}  There are no diagnoses linked to this encounter.

## 2024-01-12 NOTE — Progress Notes (Signed)
 Name: Cynthia Woods   MRN: [id number]    DOB: 03/03/1971   Date:01/12/2024       Progress Note  Subjective  Chief Complaint  Chief Complaint  Patient presents with   Headache   Stress    Onset for 2 weeks over work   Discussed the use of AI scribe software for clinical note transcription with the patient, who gave verbal consent to proceed.  History of Present Illness   Cynthia Woods is a 53 year old female who presents with work-related stress and associated symptoms.  She is experiencing significant work-related stress due to a hostile work environment, which she describes as 'calculated retaliation' following a report to HR. She feels isolated at work, with no support from her chain of leadership, and describes a lack of safe individuals to confide in. This situation has persisted for several months, contributing to her stress and affecting her mental health.  She has symptoms of depression, including crying spells, and her appetite is variable, with instances of not eating at all. Her sleep is disrupted, as she wakes up between 1 and 2 AM and struggles to fall back asleep until 3:30 AM, resulting in exhaustion during the day. She occasionally uses Advil PM to aid sleep but needs more consistent sleep support.  She experiences chronic headaches, which she attributes to the stress at work and interactions with a colleague who is aligned with her supervisor. She has a history of an empty sella and has been under the care of a neurologist. She was previously prescribed gabapentin, which she is currently taking, but it is not effective.  She has not taken escitalopram , which was previously prescribed for stress-related symptoms, as she attributes her stress to her work environment. She recently completed a course of Cipro  for a urinary tract infection.        Patient Active Problem List   Diagnosis Date Noted   Empty sella (HCC) 08/02/2022   Vitamin D  deficiency 06/24/2022    Back spasm 06/24/2022   Abnormal cervical Papanicolaou smear 06/22/2022   Abnormal Pap smear of cervix 08/03/21 LGSIL HPV+ 03/14/2022   Pure hypercholesterolemia 05/17/2021   Chronic tension-type headache, not intractable 07/03/2018   Migraine without aura and without status migrainosus, not intractable 07/03/2018   Cervical radiculopathy 07/20/2017   Benign breast cyst in female 09/09/2014    Past Surgical History:  Procedure Laterality Date   BREAST CYST ASPIRATION     COLONOSCOPY WITH PROPOFOL  N/A 11/27/2019   Procedure: COLONOSCOPY WITH PROPOFOL ;  Surgeon: Dessa Reyes ORN, MD;  Location: [facility] ENDOSCOPY;  Service: Endoscopy;  Laterality: N/A;   FOOT SURGERY  12/06/2007   GYNECOLOGIC CRYOSURGERY  12/06/1991   WISDOM TOOTH EXTRACTION  12/05/2009    Family History  Problem Relation Age of Onset   Other Mother        brain tumor   Other Maternal Aunt        brain tumor   Other Maternal Grandmother        brain tumor   Cancer Maternal Grandmother    Diabetes Maternal Grandfather    Breast cancer Paternal Grandmother    Hypertension Paternal Grandmother    Cancer Paternal Grandfather        colon    Social History   Tobacco Use   Smoking status: Never   Smokeless tobacco: Never  Substance Use Topics   Alcohol use: Yes    Alcohol/week: 2.0 standard drinks of alcohol    Types: 2  Standard drinks or equivalent per week    Comment: last use 03/13/22 3x/mo     Current Outpatient Medications:    Apple Cider Vinegar 188 MG CAPS, Take 1 capsule by mouth daily., Disp: , Rfl:    Beta Carotene (VITAMIN A) 25000 UNIT capsule, Take 25,000 Units by mouth daily., Disp: , Rfl:    Cholecalciferol (VITAMIN D ) 50 MCG (2000 UT) CAPS, Take 1 capsule (2,000 Units total) by mouth daily., Disp: 100 capsule, Rfl: 3   ciprofloxacin  (CIPRO ) 250 MG tablet, Take by mouth., Disp: , Rfl:    clotrimazole  (CLOTRIMAZOLE  ANTI-FUNGAL) 1 % cream, Apply 1 Application topically 2 (two) times daily.,  Disp: 30 g, Rfl: 0   cyanocobalamin (VITAMIN B12) 1000 MCG tablet, Take 1,000 mcg by mouth daily., Disp: , Rfl:    escitalopram  (LEXAPRO ) 10 MG tablet, Take 1 tablet (10 mg total) by mouth daily., Disp: 30 tablet, Rfl: 1   gabapentin (NEURONTIN) 100 MG capsule, Take 100 mg by mouth 3 (three) times daily., Disp: , Rfl:    Multiple Vitamins-Minerals (WOMENS MULTIVITAMIN PO), Take by mouth., Disp: , Rfl:    rosuvastatin  (CRESTOR ) 20 MG tablet, Take 1 tablet (20 mg total) by mouth daily., Disp: 90 tablet, Rfl: 1   vitamin E 45 MG (100 UNITS) capsule, Take 100 Units by mouth daily., Disp: , Rfl:    amoxicillin -clavulanate (AUGMENTIN ) 875-125 MG tablet, Take 1 tablet by mouth 2 (two) times daily. (Patient not taking: Reported on 01/12/2024), Disp: 14 tablet, Rfl: 0  Allergies  Allergen Reactions   Shellfish Allergy Anaphylaxis   Doxycycline Nausea Only   Sulfa Antibiotics    Latex Rash   Naproxen  Diarrhea, Nausea Only and Other (See Comments)   Oxycodone  Nausea Only and Nausea And Vomiting   Tramadol Nausea Only and Nausea And Vomiting    nausea    I personally reviewed active problem list, medication list, allergies, family history with the patient/caregiver today.   ROS  Ten systems reviewed and is negative except as mentioned in HPI   Objective  Vitals:   01/12/24 1347  BP: 116/74  Pulse: 99  Resp: 16  SpO2: 98%  Weight: 165 lb 14.4 oz (75.3 kg)  Height: 5' 3 (1.6 m)    Body mass index is 29.39 kg/m.  Physical Exam  Constitutional: Patient appears well-developed and well-nourished. No distress.  HEENT: head atraumatic, normocephalic, pupils equal and reactive to light, neck supple Cardiovascular: Normal rate, regular rhythm and normal heart sounds.  No murmur heard. No BLE edema. Pulmonary/Chest: Effort normal and breath sounds normal. No respiratory distress. Abdominal: Soft.  There is no tenderness. Psychiatric: . Judgment and thought content normal. Crying,  upset  Diabetic Foot Exam:     PHQ2/9:    01/12/2024    1:46 PM 11/08/2023    8:49 AM 11/08/2023    8:29 AM 10/25/2023    2:14 PM 08/11/2023    1:45 PM  Depression screen PHQ 2/9  Decreased Interest 0 0 0 1 0  Down, Depressed, Hopeless 0 0 0 1 0  PHQ - 2 Score 0 0 0 2 0  Altered sleeping 0 1 0 3 1  Tired, decreased energy 0 0 0 0 1  Change in appetite 0 0 0 0 1  Feeling bad or failure about yourself  0 0 0 2 0  Trouble concentrating 0 0 0 0 0  Moving slowly or fidgety/restless 0 0 0 0 0  Suicidal thoughts 0 0 0 0 0  PHQ-9 Score 0 1 0 7 3  Difficult doing work/chores Not difficult at all Not difficult at all Not difficult at all      phq 9 is negative  Fall Risk:    01/12/2024    1:43 PM 11/08/2023    8:29 AM 10/25/2023    2:14 PM 08/11/2023    1:44 PM 05/25/2023    2:28 PM  Fall Risk   Falls in the past year? 0 0 0 1 0  Number falls in past yr: 0 0 0 0 0  Injury with Fall? 0 0 0 0 0  Risk for fall due to : No Fall Risks No Fall Risks No Fall Risks No Fall Risks   Follow up Falls prevention discussed;Education provided;Falls evaluation completed Falls prevention discussed;Education provided;Falls evaluation completed Falls prevention discussed Falls prevention discussed      Assessment and Plan    Work-related Stress/Dysthymia  Severe stress due to perceived retaliation at work. The patient reports feeling isolated and unsupported by HR and management. This is causing significant distress and affecting her sleep and appetite. -Referral to a psychiatrist for further evaluation and management.  Insomnia Patient reports waking up between 1-2 am and not being able to fall back asleep until 3:30 am. This is likely related to her work stress. -Prescribe Trazodone  for sleep. Start with half to one tablet at bedtime.  Chronic Headaches Patient reports that stress at work triggers her headaches. She is currently on Gabapentin for management. -Continue Gabapentin as prescribed by  neurologist.  General Health Maintenance -Provide medical leave from work for one week (from 01/13/2024 to 01/22/2024) to allow for rest and recovery. -Follow-up after the patient has seen the psychiatrist.

## 2024-01-17 ENCOUNTER — Ambulatory Visit (INDEPENDENT_AMBULATORY_CARE_PROVIDER_SITE_OTHER): Payer: BC Managed Care – PPO | Admitting: Family Medicine

## 2024-01-17 ENCOUNTER — Encounter: Payer: Self-pay | Admitting: Family Medicine

## 2024-01-17 ENCOUNTER — Other Ambulatory Visit: Payer: Self-pay | Admitting: Family Medicine

## 2024-01-17 VITALS — BP 116/76 | HR 94 | Resp 16 | Ht 63.0 in | Wt 165.9 lb

## 2024-01-17 DIAGNOSIS — Z566 Other physical and mental strain related to work: Secondary | ICD-10-CM

## 2024-01-17 DIAGNOSIS — F341 Dysthymic disorder: Secondary | ICD-10-CM

## 2024-01-17 MED ORDER — SERTRALINE HCL 25 MG PO TABS: 25.0000 mg | ORAL_TABLET | Freq: Every day | ORAL | 0 refills | Status: DC

## 2024-01-17 NOTE — Progress Notes (Addendum)
Name: Cynthia Woods   MRN: [id number]    DOB: 1971/11/22   Date:01/17/2024       Progress Note  Subjective  Chief Complaint  Chief Complaint  Patient presents with   Form Completion    Pt states her work needs FMLA for the few days she missed work . I advised pt we have not received anything from her job about FMLA    Discussed the use of AI scribe software for clinical note transcription with the patient, who gave verbal consent to proceed.  History of Present Illness   Cynthia Woods is a 54 year old female who presents with work-related stress and depression.  She is experiencing significant stress and depression related to her work environment, particularly due to issues with workplace communication and management. She feels stressed about the timing of her notification to her supervisor about her absence, which she found to be unnecessarily critical and stressful. She has been out of work since Friday and is concerned about returning due to the ongoing stress. She was supposed to return to work on Monday, January 22, 2024, but is uncertain about this plan due to her current mental health state.  She has been prescribed trazodone, which is helping her sleep. She is currently waiting to speak with a psychiatrist to address her work-related stress and depression. She has not yet been contacted by the psychiatrist despite a referral being made on the previous Friday.  She has been in communication with her workplace regarding the necessary paperwork for her leave, including FMLA forms, which are to be emailed to her. She has consented to allow [employer], her [facility] provider, to access her medical records, but not her employer.        Patient Active Problem List   Diagnosis Date Noted   Empty sella (HCC) 08/02/2022   Vitamin D deficiency 06/24/2022   Back spasm 06/24/2022   Abnormal cervical Papanicolaou smear 06/22/2022   Abnormal Pap smear of cervix 08/03/21  LGSIL HPV+ 03/14/2022   Pure hypercholesterolemia 05/17/2021   Chronic tension-type headache, not intractable 07/03/2018   Migraine without aura and without status migrainosus, not intractable 07/03/2018   Cervical radiculopathy 07/20/2017   Benign breast cyst in female 09/09/2014    Social History   Tobacco Use   Smoking status: Never   Smokeless tobacco: Never  Substance Use Topics   Alcohol use: Yes    Alcohol/week: 2.0 standard drinks of alcohol    Types: 2 Standard drinks or equivalent per week    Comment: last use 03/13/22 3x/mo     Current Outpatient Medications:    Apple Cider Vinegar 188 MG CAPS, Take 1 capsule by mouth daily., Disp: , Rfl:    Beta Carotene (VITAMIN A) 25000 UNIT capsule, Take 25,000 Units by mouth daily., Disp: , Rfl:    Cholecalciferol (VITAMIN D) 50 MCG (2000 UT) CAPS, Take 1 capsule (2,000 Units total) by mouth daily., Disp: 100 capsule, Rfl: 3   clotrimazole (CLOTRIMAZOLE ANTI-FUNGAL) 1 % cream, Apply 1 Application topically 2 (two) times daily., Disp: 30 g, Rfl: 0   cyanocobalamin (VITAMIN B12) 1000 MCG tablet, Take 1,000 mcg by mouth daily., Disp: , Rfl:    gabapentin (NEURONTIN) 100 MG capsule, Take 100 mg by mouth 3 (three) times daily., Disp: , Rfl:    Multiple Vitamins-Minerals (WOMENS MULTIVITAMIN PO), Take by mouth., Disp: , Rfl:    rosuvastatin (CRESTOR) 20 MG tablet, Take 1 tablet (20 mg total) by mouth  daily., Disp: 90 tablet, Rfl: 1   traZODone (DESYREL) 50 MG tablet, Take 0.5-1 tablets (25-50 mg total) by mouth at bedtime as needed for sleep., Disp: 30 tablet, Rfl: 0   vitamin E 45 MG (100 UNITS) capsule, Take 100 Units by mouth daily., Disp: , Rfl:   Allergies  Allergen Reactions   Shellfish Allergy Anaphylaxis   Doxycycline Nausea Only   Sulfa Antibiotics    Latex Rash   Naproxen Diarrhea, Nausea Only and Other (See Comments)   Oxycodone Nausea Only and Nausea And Vomiting   Tramadol Nausea Only and Nausea And Vomiting    nausea     ROS  Ten systems reviewed and is negative except as mentioned in HPI    Objective  Vitals:   01/17/24 1115  BP: 116/76  Pulse: 94  Resp: 16  SpO2: 99%  Weight: 165 lb 14.4 oz (75.3 kg)  Height: 5\' 3"  (1.6 m)    Body mass index is 29.39 kg/m.    Physical Exam  Constitutional: Patient appears well-developed and well-nourished.  No distress.  HEENT: head atraumatic, normocephalic, pupils equal and reactive to light, neck supple Cardiovascular: Normal rate, regular rhythm and normal heart sounds.  No murmur heard. No BLE edema. Pulmonary/Chest: Effort normal and breath sounds normal. No respiratory distress. Abdominal: Soft.  There is no tenderness. Psychiatric: Upset, crying   Assessment and Plan    Work-related Stress and Depression Patient is experiencing significant stress and depression secondary to work-related issues. Trazodone is helping with sleep. Patient has expressed a desire to see a psychiatrist. -Continue Trazodone for sleep. -Referral to psychiatrist Dr. Maryruth Bun has been made. Awaiting response for appointment scheduling. ( Dr. Maryruth Bun will be out of the country and is unable to see her right away - she recommended patient to go to [facility] as walk in evaluation)  -Extend time off work until first appointment with psychiatrist. -Once FMLA paperwork is received, complete and return promptly.  Follow-up Await response from Dr. Maryruth Bun regarding appointment scheduling. Follow-up after psychiatric consultation.      Spoke to Dr. Maryruth Bun and she suggested zoloft instead of lexapro and get counseling right away to help with the stress. She has not been taking lexapro therefore does not need to wean self off medication

## 2024-01-17 NOTE — Addendum Note (Signed)
Addended by: Alba Cory F on: 01/17/2024 12:14 PM   Modules accepted: Orders

## 2024-01-17 NOTE — Patient Instructions (Signed)
Note: Referral has been sent to Dr. Caryn Section   P: [phone] F: [phone]   Release ID # [id number]

## 2024-01-18 ENCOUNTER — Telehealth: Payer: Self-pay | Admitting: Family Medicine

## 2024-01-18 ENCOUNTER — Encounter: Payer: Self-pay | Admitting: Family Medicine

## 2024-01-18 DIAGNOSIS — F411 Generalized anxiety disorder: Secondary | ICD-10-CM | POA: Diagnosis not present

## 2024-01-18 NOTE — Telephone Encounter (Signed)
Pt is asking that you return her call. She went to [facility] on yesterday but they where at full capacity. She went back this morning and just left there, but while she was there [facility] contacted her and said they are able to get her in tomorrow and they have sent her paperwork via email. [facility] currently do not have any openings. Pt would like to know which way she should go. Should she wait for [facility] (your original plan) or go ahead and take the appointment with [facility].

## 2024-01-19 DIAGNOSIS — F332 Major depressive disorder, recurrent severe without psychotic features: Secondary | ICD-10-CM | POA: Diagnosis not present

## 2024-01-23 ENCOUNTER — Ambulatory Visit
Admission: RE | Admit: 2024-01-23 | Discharge: 2024-01-23 | Disposition: A | Discharge status: Home / Self Care | Payer: BC Managed Care – PPO | Source: Ambulatory Visit | Attending: Family Medicine | Admitting: Family Medicine

## 2024-01-23 DIAGNOSIS — F332 Major depressive disorder, recurrent severe without psychotic features: Secondary | ICD-10-CM | POA: Diagnosis not present

## 2024-01-23 DIAGNOSIS — Z1231 Encounter for screening mammogram for malignant neoplasm of breast: Secondary | ICD-10-CM | POA: Insufficient documentation

## 2024-01-25 ENCOUNTER — Encounter: Payer: Self-pay | Admitting: Family Medicine

## 2024-02-01 DIAGNOSIS — F332 Major depressive disorder, recurrent severe without psychotic features: Secondary | ICD-10-CM | POA: Diagnosis not present

## 2024-02-01 DIAGNOSIS — F4310 Post-traumatic stress disorder, unspecified: Secondary | ICD-10-CM | POA: Insufficient documentation

## 2024-02-02 ENCOUNTER — Encounter: Payer: Self-pay | Admitting: Family Medicine

## 2024-02-02 ENCOUNTER — Ambulatory Visit (INDEPENDENT_AMBULATORY_CARE_PROVIDER_SITE_OTHER): Payer: BC Managed Care – PPO | Admitting: Family Medicine

## 2024-02-02 VITALS — BP 128/84 | HR 90 | Temp 98.2°F | Resp 16 | Ht 63.0 in | Wt 167.6 lb

## 2024-02-02 DIAGNOSIS — F5102 Adjustment insomnia: Secondary | ICD-10-CM

## 2024-02-02 DIAGNOSIS — Z566 Other physical and mental strain related to work: Secondary | ICD-10-CM | POA: Diagnosis not present

## 2024-02-02 DIAGNOSIS — F341 Dysthymic disorder: Secondary | ICD-10-CM | POA: Diagnosis not present

## 2024-02-02 MED ORDER — SERTRALINE HCL 50 MG PO TABS
50.0000 mg | ORAL_TABLET | Freq: Every day | ORAL | 0 refills | Status: AC
Start: 2024-02-02 — End: ?

## 2024-02-02 NOTE — Progress Notes (Signed)
 Name: Cynthia Woods   MRN: [id number]    DOB: 1971-04-26   Date:02/02/2024       Progress Note  Subjective  Chief Complaint  Chief Complaint  Patient presents with   Follow-up    HPI   Dysthymia Cynthia Woods stress/insomnia: she was seen 01/12/2024 due to increase in stress at work, headaches prior to work, feeling unsupported. She has been out of work since. She is ruminating, she is seeing therapist, going to see a psychiatrist March 18 th 2025. She is still having panic attacks thinking about going back to work. She states therapy helps but even talking about work causes her to cry. She is still has a poor appetite, not sleeping well, crying spells. She is taking zoloft . Taking trazodone prn and sometimes still does not work. She feels tired but unable to sleep since mind is busy    Patient Active Problem List   Diagnosis Date Noted   Posttraumatic stress disorder 02/01/2024   Severe recurrent major depression without psychotic features (HCC) 02/01/2024   Empty sella (HCC) 08/02/2022   Vitamin D deficiency 06/24/2022   Back spasm 06/24/2022   Abnormal cervical Papanicolaou smear 06/22/2022   Abnormal Pap smear of cervix 08/03/21 LGSIL HPV+ 03/14/2022   Pure hypercholesterolemia 05/17/2021   Chronic tension-type headache, not intractable 07/03/2018   Migraine without aura and without status migrainosus, not intractable 07/03/2018   Cervical radiculopathy 07/20/2017   Benign breast cyst in female 09/09/2014    Social History   Tobacco Use   Smoking status: Never   Smokeless tobacco: Never  Substance Use Topics   Alcohol use: Yes    Alcohol/week: 2.0 standard drinks of alcohol    Types: 2 Standard drinks or equivalent per week    Comment: last use 03/13/22 3x/mo     Current Outpatient Medications:    Apple Cider Vinegar 188 MG CAPS, Take 1 capsule by mouth daily., Disp: , Rfl:    Beta Carotene (VITAMIN A) 25000 UNIT capsule, Take 25,000 Units by mouth daily., Disp: , Rfl:     Cholecalciferol (VITAMIN D) 50 MCG (2000 UT) CAPS, Take 1 capsule (2,000 Units total) by mouth daily., Disp: 100 capsule, Rfl: 3   clotrimazole (CLOTRIMAZOLE ANTI-FUNGAL) 1 % cream, Apply 1 Application topically 2 (two) times daily., Disp: 30 g, Rfl: 0   cyanocobalamin (VITAMIN B12) 1000 MCG tablet, Take 1,000 mcg by mouth daily., Disp: , Rfl:    gabapentin (NEURONTIN) 100 MG capsule, Take 100 mg by mouth 3 (three) times daily., Disp: , Rfl:    Multiple Vitamins-Minerals (WOMENS MULTIVITAMIN PO), Take by mouth., Disp: , Rfl:    rosuvastatin (CRESTOR) 20 MG tablet, Take 1 tablet (20 mg total) by mouth daily., Disp: 90 tablet, Rfl: 1   sertraline (ZOLOFT) 25 MG tablet, Take 1 tablet (25 mg total) by mouth daily., Disp: 30 tablet, Rfl: 0   traZODone (DESYREL) 50 MG tablet, Take 0.5-1 tablets (25-50 mg total) by mouth at bedtime as needed for sleep., Disp: 30 tablet, Rfl: 0   vitamin E 45 MG (100 UNITS) capsule, Take 100 Units by mouth daily., Disp: , Rfl:   Allergies  Allergen Reactions   Shellfish Allergy Anaphylaxis   Doxycycline Nausea Only   Sulfa Antibiotics    Latex Rash   Naproxen Diarrhea, Nausea Only and Other (See Comments)   Oxycodone Nausea Only and Nausea And Vomiting   Tramadol Nausea Only and Nausea And Vomiting    nausea    ROS  Ten  systems reviewed and is negative except as mentioned in HPI    Objective  Vitals:   02/02/24 1317  BP: 128/84  Pulse: 90  Resp: 16  Temp: 98.2 F (36.8 C)  TempSrc: Oral  SpO2: 99%  Weight: 167 lb 9.6 oz (76 kg)  Height: 5\' 3"  (1.6 m)    Body mass index is 29.69 kg/m.    Physical Exam  Constitutional: Patient appears well-developed and well-nourished. Obese  No distress.  HEENT: head atraumatic, normocephalic, pupils equal and reactive to light, neck supple Cardiovascular: Normal rate, regular rhythm and normal heart sounds.  No murmur heard. No BLE edema. Pulmonary/Chest: Effort normal and breath sounds normal. No  respiratory distress. Abdominal: Soft.  There is no tenderness. Psychiatric: Patient has a normal mood and affect. behavior is normal. Judgment and thought content normal.    Assessment & Plan  1. Dysthymia (Primary)  - sertraline (ZOLOFT) 50 MG tablet; Take 1 tablet (50 mg total) by mouth daily.  Dispense: 30 tablet; Refill: 0  2. Work-related stress  - sertraline (ZOLOFT) 50 MG tablet; Take 1 tablet (50 mg total) by mouth daily.  Dispense: 30 tablet; Refill: 0 Continue therapy, keep appointment with psychiatrist , stay out of work until she sees Psychiatrist   3. Insomnia due to psychological stress  Taking prn medication

## 2024-02-05 ENCOUNTER — Telehealth: Payer: Self-pay

## 2024-02-05 DIAGNOSIS — Z780 Asymptomatic menopausal state: Secondary | ICD-10-CM | POA: Diagnosis not present

## 2024-02-05 DIAGNOSIS — N760 Acute vaginitis: Secondary | ICD-10-CM | POA: Diagnosis not present

## 2024-02-05 NOTE — Telephone Encounter (Signed)
 Pt came to the office with information to [employer]. She would like a call back as soon as possible to explain exactly what she is needing. Pt left paper unfront with fax # and case # to give to CMA/Dr Sowles.

## 2024-02-06 DIAGNOSIS — F332 Major depressive disorder, recurrent severe without psychotic features: Secondary | ICD-10-CM | POA: Diagnosis not present

## 2024-02-07 NOTE — Telephone Encounter (Signed)
 Spoke to patient advised we filled out everything we could on our end. If something is missing will need to re fax them back to [place]

## 2024-02-07 NOTE — Telephone Encounter (Unsigned)
 Copied from CRM [id number]. Topic: General - Other >> Feb 07, 2024 10:41 AM Epimenio Foot F wrote: Reason for CRM: Patient is calling in because she needs Dr. Carlynn Purl to call her sometime today. Patient says [employer] says there are things that Dr. Carlynn Purl didn't circle and specify. Please follow up with patient.

## 2024-02-08 ENCOUNTER — Other Ambulatory Visit: Payer: Self-pay | Admitting: Family Medicine

## 2024-02-08 DIAGNOSIS — F5102 Adjustment insomnia: Secondary | ICD-10-CM

## 2024-02-19 DIAGNOSIS — N390 Urinary tract infection, site not specified: Secondary | ICD-10-CM | POA: Diagnosis not present

## 2024-02-19 DIAGNOSIS — N760 Acute vaginitis: Secondary | ICD-10-CM | POA: Diagnosis not present

## 2024-02-19 DIAGNOSIS — R8290 Unspecified abnormal findings in urine: Secondary | ICD-10-CM | POA: Diagnosis not present

## 2024-02-19 DIAGNOSIS — N771 Vaginitis, vulvitis and vulvovaginitis in diseases classified elsewhere: Secondary | ICD-10-CM | POA: Diagnosis not present

## 2024-02-20 DIAGNOSIS — F331 Major depressive disorder, recurrent, moderate: Secondary | ICD-10-CM | POA: Diagnosis not present

## 2024-02-20 DIAGNOSIS — F4322 Adjustment disorder with anxiety: Secondary | ICD-10-CM | POA: Diagnosis not present

## 2024-02-29 DIAGNOSIS — F4322 Adjustment disorder with anxiety: Secondary | ICD-10-CM | POA: Diagnosis not present

## 2024-02-29 DIAGNOSIS — F331 Major depressive disorder, recurrent, moderate: Secondary | ICD-10-CM | POA: Diagnosis not present

## 2024-03-11 ENCOUNTER — Telehealth: Payer: Self-pay

## 2024-03-11 NOTE — Telephone Encounter (Signed)
New work note sent through my chart.

## 2024-03-11 NOTE — Telephone Encounter (Signed)
 Copied from CRM [id number]. Topic: General - Other >> Mar 11, 2024 10:47 AM Fredrich Romans wrote: Reason for CRM: Patient would like to know if Dr Carlynn Purl has heard from Doctor Maryruth Bun regarding her work note that extended her put of work until April 10th? She stated that she needs the note today by 1 pm.Its okay to place note on her mychart

## 2024-03-17 ENCOUNTER — Encounter: Payer: Self-pay | Admitting: Family Medicine

## 2024-06-05 ENCOUNTER — Encounter: Payer: Self-pay | Admitting: Family Medicine

## 2024-06-06 ENCOUNTER — Ambulatory Visit (INDEPENDENT_AMBULATORY_CARE_PROVIDER_SITE_OTHER): Admitting: Nurse Practitioner

## 2024-06-06 ENCOUNTER — Other Ambulatory Visit (HOSPITAL_COMMUNITY)
Admission: RE | Admit: 2024-06-06 | Discharge: 2024-06-06 | Disposition: A | Discharge status: Home / Self Care | Source: Ambulatory Visit | Attending: Nurse Practitioner | Admitting: Nurse Practitioner

## 2024-06-06 ENCOUNTER — Encounter: Payer: Self-pay | Admitting: Nurse Practitioner

## 2024-06-06 VITALS — BP 116/74 | HR 105 | Resp 18 | Ht 63.0 in | Wt 168.0 lb

## 2024-06-06 DIAGNOSIS — N898 Other specified noninflammatory disorders of vagina: Secondary | ICD-10-CM | POA: Insufficient documentation

## 2024-06-06 NOTE — Progress Notes (Signed)
 BP 116/74   Pulse (!) 105   Resp 18   Ht 5' 3 (1.6 m)   Wt 168 lb (76.2 kg)   LMP 05/29/2024   SpO2 97%   BMI 29.76 kg/m    Subjective:    Patient ID: Cynthia Woods, female    DOB: [birth year], 53 y.o.   MRN: [id number]  HPI: [name] is a 53 y.o. female  Chief Complaint  Patient presents with   Vaginal Discharge    Vag discharge couple days ago.   LMP: 08/26/24 very light periods Sexually active w/ one partner vaginal sex.   Reports vaginal discharge (no odor) for 3 days. Denies dysuria, flank pain, or hematuria. Reports being sexually active with 1 partner. Reports lmp was 05/26/24. Reports she gets recurrent BV infections and symptoms present similar to today's sx.         02/02/2024    1:16 PM 01/17/2024   11:17 AM 01/12/2024    1:46 PM  Depression screen PHQ 2/9  Decreased Interest 2 0 0  Down, Depressed, Hopeless 2 0 0  PHQ - 2 Score 4 0 0  Altered sleeping 2 0 0  Tired, decreased energy 2 0 0  Change in appetite 2 0 0  Feeling bad or failure about yourself  2 0 0  Trouble concentrating 2 0 0  Moving slowly or fidgety/restless 0 0 0  Suicidal thoughts 0 0 0  PHQ-9 Score 14 0 0  Difficult doing work/chores Extremely dIfficult Not difficult at all Not difficult at all    Relevant past medical, surgical, family and social history reviewed and updated as indicated. Interim medical history since our last visit reviewed. Allergies and medications reviewed and updated.  Review of Systems  Ten systems reviewed and is negative except as mentioned in HPI      Objective:     BP 116/74   Pulse (!) 105   Resp 18   Ht 5' 3 (1.6 m)   Wt 168 lb (76.2 kg)   LMP 05/29/2024   SpO2 97%   BMI 29.76 kg/m    Wt Readings from Last 3 Encounters:  06/06/24 168 lb (76.2 kg)  02/02/24 167 lb 9.6 oz (76 kg)  01/17/24 165 lb 14.4 oz (75.3 kg)    Physical Exam Vitals reviewed.  Constitutional:      Appearance: Normal appearance.  HENT:      Head: Normocephalic.  Cardiovascular:     Rate and Rhythm: Normal rate and regular rhythm.  Pulmonary:     Effort: Pulmonary effort is normal.     Breath sounds: Normal breath sounds.  Musculoskeletal:        General: Normal range of motion.  Skin:    General: Skin is warm and dry.  Neurological:     General: No focal deficit present.     Mental Status: She is alert and oriented to person, place, and time. Mental status is at baseline.  Psychiatric:        Mood and Affect: Mood normal.        Behavior: Behavior normal.        Thought Content: Thought content normal.        Judgment: Judgment normal.      Results for orders placed or performed in visit on 08/11/23  Lipid panel   Collection Time: 08/11/23  3:01 PM  Result Value Ref Range   Cholesterol 153 <200 mg/dL   HDL 64 > OR =  50 mg/dL   Triglycerides 45 <849 mg/dL   LDL Cholesterol (Calc) 76 mg/dL (calc)   Total CHOL/HDL Ratio 2.4 <5.0 (calc)   Non-HDL Cholesterol (Calc) 89 <869 mg/dL (calc)  COMPLETE METABOLIC PANEL WITH GFR   Collection Time: 08/11/23  3:01 PM  Result Value Ref Range   Glucose, Bld 89 65 - 99 mg/dL   BUN 11 7 - 25 mg/dL   Creat 9.22 9.49 - 9.00 mg/dL   eGFR 95 > OR = 60 fO/fpw/8.26f7   BUN/Creatinine Ratio SEE NOTE: 6 - 22 (calc)   Sodium 139 135 - 146 mmol/L   Potassium 4.5 3.5 - 5.3 mmol/L   Chloride 105 98 - 110 mmol/L   CO2 28 20 - 32 mmol/L   Calcium  9.4 8.6 - 10.2 mg/dL   Total Protein 7.0 6.1 - 8.1 g/dL   Albumin 4.2 3.6 - 5.1 g/dL   Globulin 2.8 1.9 - 3.7 g/dL (calc)   AG Ratio 1.5 1.0 - 2.5 (calc)   Total Bilirubin 0.5 0.2 - 1.2 mg/dL   Alkaline phosphatase (APISO) 54 31 - 125 U/L   AST 18 10 - 35 U/L   ALT 20 6 - 29 U/L          Assessment & Plan:   Problem List Items Addressed This Visit   None Visit Diagnoses       Vaginal discharge    -  Primary   Vaginal wet prep swab performed. Will contact patient with results.   Relevant Orders   Cervicovaginal ancillary only            Follow up plan: Return if symptoms worsen or fail to improve.

## 2024-06-06 NOTE — Progress Notes (Deleted)
 BP 116/74   Pulse (!) 105   Resp 18   Ht 5' 3 (1.6 m)   Wt 168 lb (76.2 kg)   LMP 05/29/2024   SpO2 97%   BMI 29.76 kg/m    Subjective:    Patient ID: Cynthia Woods, female    DOB: 1971-09-28, 53 y.o.   MRN: [id number]  HPI: Cynthia Woods is a 52 y.o. female  Chief Complaint  Patient presents with   Vaginal Discharge   Reports vaginal discharge (no odor) for 3 days. Denies dysuria, flank pain, or hematuria. Reports being sexually active with 1 partner. Reports lmp was 05/26/24. Reports she gets recurrent BV infections and symptoms present similar to today's sx.     02/02/2024    1:16 PM 01/17/2024   11:17 AM 01/12/2024    1:46 PM  Depression screen PHQ 2/9  Decreased Interest 2 0 0  Down, Depressed, Hopeless 2 0 0  PHQ - 2 Score 4 0 0  Altered sleeping 2 0 0  Tired, decreased energy 2 0 0  Change in appetite 2 0 0  Feeling bad or failure about yourself  2 0 0  Trouble concentrating 2 0 0  Moving slowly or fidgety/restless 0 0 0  Suicidal thoughts 0 0 0  PHQ-9 Score 14 0 0  Difficult doing work/chores Extremely dIfficult Not difficult at all Not difficult at all    Relevant past medical, surgical, family and social history reviewed and updated as indicated. Interim medical history since our last visit reviewed. Allergies and medications reviewed and updated.  Review of Systems  Ten systems reviewed and is negative except as mentioned in HPI      Objective:     BP 116/74   Pulse (!) 105   Resp 18   Ht 5' 3 (1.6 m)   Wt 168 lb (76.2 kg)   LMP 05/29/2024   SpO2 97%   BMI 29.76 kg/m   {Vitals History (Optional):23777} Wt Readings from Last 3 Encounters:  06/06/24 168 lb (76.2 kg)  02/02/24 167 lb 9.6 oz (76 kg)  01/17/24 165 lb 14.4 oz (75.3 kg)    Physical Exam Constitutional:      Appearance: Normal appearance.  HENT:     Head: Normocephalic and atraumatic.  Cardiovascular:     Rate and Rhythm: Normal rate and regular rhythm.      Pulses: Normal pulses.     Heart sounds: Normal heart sounds.  Pulmonary:     Effort: Pulmonary effort is normal.     Breath sounds: Normal breath sounds.  Neurological:     General: No focal deficit present.     Mental Status: She is alert and oriented to person, place, and time.  Psychiatric:        Mood and Affect: Mood normal.        Behavior: Behavior normal.        Thought Content: Thought content normal.      Results for orders placed or performed in visit on 08/11/23  Lipid panel   Collection Time: 08/11/23  3:01 PM  Result Value Ref Range   Cholesterol 153 <200 mg/dL   HDL 64 > OR = 50 mg/dL   Triglycerides 45 <849 mg/dL   LDL Cholesterol (Calc) 76 mg/dL (calc)   Total CHOL/HDL Ratio 2.4 <5.0 (calc)   Non-HDL Cholesterol (Calc) 89 <869 mg/dL (calc)  COMPLETE METABOLIC PANEL WITH GFR   Collection Time: 08/11/23  3:01 PM  Result  Value Ref Range   Glucose, Bld 89 65 - 99 mg/dL   BUN 11 7 - 25 mg/dL   Creat 9.22 9.49 - 9.00 mg/dL   eGFR 95 > OR = 60 fO/fpw/8.26f7   BUN/Creatinine Ratio SEE NOTE: 6 - 22 (calc)   Sodium 139 135 - 146 mmol/L   Potassium 4.5 3.5 - 5.3 mmol/L   Chloride 105 98 - 110 mmol/L   CO2 28 20 - 32 mmol/L   Calcium  9.4 8.6 - 10.2 mg/dL   Total Protein 7.0 6.1 - 8.1 g/dL   Albumin 4.2 3.6 - 5.1 g/dL   Globulin 2.8 1.9 - 3.7 g/dL (calc)   AG Ratio 1.5 1.0 - 2.5 (calc)   Total Bilirubin 0.5 0.2 - 1.2 mg/dL   Alkaline phosphatase (APISO) 54 31 - 125 U/L   AST 18 10 - 35 U/L   ALT 20 6 - 29 U/L   {Labs (Optional):23779}       Assessment & Plan:   Problem List Items Addressed This Visit   None Visit Diagnoses       Vaginal discharge    -  Primary   Vaginal wet prep swab performed.   Relevant Orders   Cervicovaginal ancillary only              Follow up plan: Return if symptoms worsen or fail to improve.

## 2024-06-11 LAB — CERVICOVAGINAL ANCILLARY ONLY
Bacterial Vaginitis (gardnerella): POSITIVE — AB
Candida Glabrata: NEGATIVE
Candida Vaginitis: NEGATIVE
Chlamydia: NEGATIVE
Comment: NEGATIVE
Comment: NEGATIVE
Comment: NEGATIVE
Comment: NEGATIVE
Comment: NEGATIVE
Comment: NORMAL
Neisseria Gonorrhea: NEGATIVE
Trichomonas: NEGATIVE

## 2024-06-12 ENCOUNTER — Encounter: Payer: Self-pay | Admitting: Nurse Practitioner

## 2024-06-12 ENCOUNTER — Ambulatory Visit: Payer: Self-pay | Admitting: Nurse Practitioner

## 2024-06-12 ENCOUNTER — Other Ambulatory Visit: Payer: Self-pay | Admitting: Nurse Practitioner

## 2024-06-12 DIAGNOSIS — B9689 Other specified bacterial agents as the cause of diseases classified elsewhere: Secondary | ICD-10-CM

## 2024-06-12 MED ORDER — METRONIDAZOLE 0.75 % VA GEL: 1.0000 | Freq: Every day | VAGINAL | 0 refills | Status: AC

## 2024-06-12 MED ORDER — METRONIDAZOLE 500 MG PO TABS: 500.0000 mg | ORAL_TABLET | Freq: Two times a day (BID) | ORAL | 0 refills | Status: AC

## 2024-07-10 ENCOUNTER — Other Ambulatory Visit: Payer: Self-pay | Admitting: Physician Assistant

## 2024-07-10 DIAGNOSIS — R519 Headache, unspecified: Secondary | ICD-10-CM

## 2024-07-13 ENCOUNTER — Ambulatory Visit
Admission: RE | Admit: 2024-07-13 | Discharge: 2024-07-13 | Disposition: A | Discharge status: Home / Self Care | Source: Ambulatory Visit | Attending: Physician Assistant | Admitting: Physician Assistant

## 2024-07-13 DIAGNOSIS — R519 Headache, unspecified: Secondary | ICD-10-CM

## 2024-07-16 ENCOUNTER — Other Ambulatory Visit: Payer: Self-pay | Admitting: Surgery

## 2024-07-16 DIAGNOSIS — N644 Mastodynia: Secondary | ICD-10-CM

## 2024-07-16 DIAGNOSIS — N6310 Unspecified lump in the right breast, unspecified quadrant: Secondary | ICD-10-CM

## 2024-07-17 ENCOUNTER — Ambulatory Visit
Admission: RE | Admit: 2024-07-17 | Discharge: 2024-07-17 | Disposition: A | Discharge status: Home / Self Care | Source: Ambulatory Visit | Attending: Surgery | Admitting: Surgery

## 2024-07-17 DIAGNOSIS — N644 Mastodynia: Secondary | ICD-10-CM

## 2024-07-17 DIAGNOSIS — N6001 Solitary cyst of right breast: Secondary | ICD-10-CM | POA: Insufficient documentation

## 2024-07-17 DIAGNOSIS — N6310 Unspecified lump in the right breast, unspecified quadrant: Secondary | ICD-10-CM | POA: Insufficient documentation

## 2024-07-17 DIAGNOSIS — F332 Major depressive disorder, recurrent severe without psychotic features: Secondary | ICD-10-CM

## 2024-07-17 MED ORDER — LIDOCAINE HCL 1 % IJ SOLN
5.0000 mL | Freq: Once | INTRAMUSCULAR | Status: AC
  Administered 2024-07-17 (×2): 5 mL
  Filled 2024-07-17: qty 5

## 2024-07-21 LAB — BODY FLUID CULTURE W GRAM STAIN: Culture: NO GROWTH

## 2024-08-19 ENCOUNTER — Encounter: Payer: Self-pay | Admitting: Family Medicine

## 2024-08-19 ENCOUNTER — Other Ambulatory Visit: Payer: Self-pay | Admitting: Medical Genetics

## 2024-08-23 ENCOUNTER — Other Ambulatory Visit: Payer: Self-pay

## 2024-08-23 DIAGNOSIS — E7800 Pure hypercholesterolemia, unspecified: Secondary | ICD-10-CM

## 2024-08-23 DIAGNOSIS — Z79899 Other long term (current) drug therapy: Secondary | ICD-10-CM

## 2024-08-23 DIAGNOSIS — E559 Vitamin D deficiency, unspecified: Secondary | ICD-10-CM

## 2024-08-23 NOTE — Patient Instructions (Signed)
 Preventive Care 51-51 Years Old, Female  Preventive care refers to lifestyle choices and visits with your health care provider that can promote health and wellness. Preventive care visits are also called wellness exams.  What can I expect for my preventive care visit?  Counseling  Your health care provider may ask you questions about your:  Medical history, including:  Past medical problems.  Family medical history.  Pregnancy history.  Current health, including:  Menstrual cycle.  Method of birth control.  Emotional well-being.  Home life and relationship well-being.  Sexual activity and sexual health.  Lifestyle, including:  Alcohol, nicotine or tobacco, and drug use.  Access to firearms.  Diet, exercise, and sleep habits.  Work and work Astronomer.  Sunscreen use.  Safety issues such as seatbelt and bike helmet use.  Physical exam  Your health care provider will check your:  Height and weight. These may be used to calculate your BMI (body mass index). BMI is a measurement that tells if you are at a healthy weight.  Waist circumference. This measures the distance around your waistline. This measurement also tells if you are at a healthy weight and may help predict your risk of certain diseases, such as type 2 diabetes and high blood pressure.  Heart rate and blood pressure.  Body temperature.  Skin for abnormal spots.  What immunizations do I need?    Vaccines are usually given at various ages, according to a schedule. Your health care provider will recommend vaccines for you based on your age, medical history, and lifestyle or other factors, such as travel or where you work.  What tests do I need?  Screening  Your health care provider may recommend screening tests for certain conditions. This may include:  Lipid and cholesterol levels.  Diabetes screening. This is done by checking your blood sugar (glucose) after you have not eaten for a while (fasting).  Pelvic exam and Pap test.  Hepatitis B test.  Hepatitis C  test.  HIV (human immunodeficiency virus) test.  STI (sexually transmitted infection) testing, if you are at risk.  Lung cancer screening.  Colorectal cancer screening.  Mammogram. Talk with your health care provider about when you should start having regular mammograms. This may depend on whether you have a family history of breast cancer.  BRCA-related cancer screening. This may be done if you have a family history of breast, ovarian, tubal, or peritoneal cancers.  Bone density scan. This is done to screen for osteoporosis.  Talk with your health care provider about your test results, treatment options, and if necessary, the need for more tests.  Follow these instructions at home:  Eating and drinking    Eat a diet that includes fresh fruits and vegetables, whole grains, lean protein, and low-fat dairy products.  Take vitamin and mineral supplements as recommended by your health care provider.  Do not drink alcohol if:  Your health care provider tells you not to drink.  You are pregnant, may be pregnant, or are planning to become pregnant.  If you drink alcohol:  Limit how much you have to 0-1 drink a day.  Know how much alcohol is in your drink. In the [place] one drink equals one 12 oz bottle of beer (355 mL), one 5 oz glass of wine (148 mL), or one 1 oz glass of hard liquor (44 mL).  Lifestyle  Brush your teeth every morning and night with fluoride toothpaste. Floss one time each day.  Exercise for at least  30 minutes 5 or more days each week.  Do not use any products that contain nicotine or tobacco. These products include cigarettes, chewing tobacco, and vaping devices, such as e-cigarettes. If you need help quitting, ask your health care provider.  Do not use drugs.  If you are sexually active, practice safe sex. Use a condom or other form of protection to prevent STIs.  If you do not wish to become pregnant, use a form of birth control. If you plan to become pregnant, see your health care provider for a  prepregnancy visit.  Take aspirin only as told by your health care provider. Make sure that you understand how much to take and what form to take. Work with your health care provider to find out whether it is safe and beneficial for you to take aspirin daily.  Find healthy ways to manage stress, such as:  Meditation, yoga, or listening to music.  Journaling.  Talking to a trusted person.  Spending time with friends and family.  Minimize exposure to UV radiation to reduce your risk of skin cancer.  Safety  Always wear your seat belt while driving or riding in a vehicle.  Do not drive:  If you have been drinking alcohol. Do not ride with someone who has been drinking.  When you are tired or distracted.  While texting.  If you have been using any mind-altering substances or drugs.  Wear a helmet and other protective equipment during sports activities.  If you have firearms in your house, make sure you follow all gun safety procedures.  Seek help if you have been physically or sexually abused.  What's next?  Visit your health care provider once a year for an annual wellness visit.  Ask your health care provider how often you should have your eyes and teeth checked.  Stay up to date on all vaccines.  This information is not intended to replace advice given to you by your health care provider. Make sure you discuss any questions you have with your health care provider.  Document Revised: 05/19/2021 Document Reviewed: 05/19/2021  Elsevier Patient Education  2024 [employer].

## 2024-08-26 ENCOUNTER — Encounter: Payer: Self-pay | Admitting: Family Medicine

## 2024-08-26 ENCOUNTER — Other Ambulatory Visit

## 2024-08-26 ENCOUNTER — Other Ambulatory Visit (HOSPITAL_COMMUNITY)
Admission: RE | Admit: 2024-08-26 | Discharge: 2024-08-26 | Disposition: A | Discharge status: Home / Self Care | Source: Ambulatory Visit | Attending: Family Medicine | Admitting: Family Medicine

## 2024-08-26 ENCOUNTER — Ambulatory Visit (INDEPENDENT_AMBULATORY_CARE_PROVIDER_SITE_OTHER): Admitting: Family Medicine

## 2024-08-26 VITALS — BP 106/72 | HR 78 | Resp 16 | Ht 63.0 in | Wt 168.7 lb

## 2024-08-26 DIAGNOSIS — Z124 Encounter for screening for malignant neoplasm of cervix: Secondary | ICD-10-CM

## 2024-08-26 DIAGNOSIS — Z01411 Encounter for gynecological examination (general) (routine) with abnormal findings: Secondary | ICD-10-CM | POA: Diagnosis not present

## 2024-08-26 DIAGNOSIS — Z01419 Encounter for gynecological examination (general) (routine) without abnormal findings: Secondary | ICD-10-CM | POA: Diagnosis present

## 2024-08-26 DIAGNOSIS — N951 Menopausal and female climacteric states: Secondary | ICD-10-CM | POA: Diagnosis not present

## 2024-08-26 DIAGNOSIS — Z1151 Encounter for screening for human papillomavirus (HPV): Secondary | ICD-10-CM | POA: Insufficient documentation

## 2024-08-26 DIAGNOSIS — Z113 Encounter for screening for infections with a predominantly sexual mode of transmission: Secondary | ICD-10-CM | POA: Insufficient documentation

## 2024-08-26 NOTE — Progress Notes (Signed)
 Name: Cynthia Woods   MRN: [id number]    DOB: October 19, 1971   Date:08/26/2024       Progress Note  Subjective  Chief Complaint  Chief Complaint  Patient presents with   Annual Exam    HPI  Patient presents for annual CPE.  Diet: balanced, fruit, vegetables and lean meat Exercise: walking Tuesday and Thursdays and also going to the gym  Last Eye Exam: completed Last Dental Exam: completed  Flowsheet Row [facility] Visit from 08/26/2024 in [facility]  AUDIT-C Score 1   Depression: Phq 9 is  negative    08/26/2024    1:28 PM 02/02/2024    1:16 PM 01/17/2024   11:17 AM 01/12/2024    1:46 PM 11/08/2023    8:49 AM  Depression screen PHQ 2/9  Decreased Interest 0 2 0 0 0  Down, Depressed, Hopeless 0 2 0 0 0  PHQ - 2 Score 0 4 0 0 0  Altered sleeping 0 2 0 0 1  Tired, decreased energy 0 2 0 0 0  Change in appetite 0 2 0 0 0  Feeling bad or failure about yourself  0 2 0 0 0  Trouble concentrating 0 2 0 0 0  Moving slowly or fidgety/restless 0 0 0 0 0  Suicidal thoughts 0 0 0 0 0  PHQ-9 Score 0 14 0 0 1  Difficult doing work/chores Not difficult at all Extremely dIfficult Not difficult at all Not difficult at all Not difficult at all   Hypertension: BP Readings from Last 3 Encounters:  08/26/24 106/72  06/06/24 116/74  02/02/24 128/84   Obesity: Wt Readings from Last 3 Encounters:  08/26/24 168 lb 11.2 oz (76.5 kg)  06/06/24 168 lb (76.2 kg)  02/02/24 167 lb 9.6 oz (76 kg)   BMI Readings from Last 3 Encounters:  08/26/24 29.88 kg/m  06/06/24 29.76 kg/m  02/02/24 29.69 kg/m     Vaccines: reviewed with the patient.   Hep C Screening: completed STD testing and prevention (HIV/chl/gon/syphilis): checking today  Intimate partner violence: negative screen  Sexual History : had sex recently with an old partner - same person for the past 5 years - but not on a long term relationship  Menstrual History/LMP/Abnormal Bleeding: regular cycles ,  having hot flashes, weight fluctuation  Discussed importance of follow up if any post-menopausal bleeding: not applicable  Incontinence Symptoms: negative for symptoms   Breast cancer:  - Last Mammogram: up to date  - BRCA gene screening: N/A  Osteoporosis Prevention : Discussed high calcium  and vitamin D  supplementation, weight bearing exercises Bone density :not applicable   Cervical cancer screening: performing today  Skin cancer: Discussed monitoring for atypical lesions  Colorectal cancer: up to date    Lung cancer:  Low Dose CT Chest recommended if Age 67-80 years, 20 pack-year currently smoking OR have quit w/in 15years. Patient does not qualify for screen   ECG: 2021  Advanced Care Planning: A voluntary discussion about advance care planning including the explanation and discussion of advance directives.  Discussed health care proxy and Living will, and the patient was able to identify a health care proxy as Javion - her son .  Patient does have a living will and power of attorney of health care   Patient Active Problem List   Diagnosis Date Noted   Posttraumatic stress disorder 02/01/2024   Severe recurrent major depression without psychotic features (HCC) 02/01/2024   Empty sella 08/02/2022  Vitamin D  deficiency 06/24/2022   Back spasm 06/24/2022   Abnormal cervical Papanicolaou smear 06/22/2022   Abnormal Pap smear of cervix 08/03/21 LGSIL HPV+ 03/14/2022   Pure hypercholesterolemia 05/17/2021   Chronic tension-type headache, not intractable 07/03/2018   Migraine without aura and without status migrainosus, not intractable 07/03/2018   Cervical radiculopathy 07/20/2017   Benign breast cyst in female 09/09/2014    Past Surgical History:  Procedure Laterality Date   BREAST CYST ASPIRATION     COLONOSCOPY WITH PROPOFOL  N/A 11/27/2019   Procedure: COLONOSCOPY WITH PROPOFOL ;  Surgeon: Dessa Reyes ORN, MD;  Location: [facility] ENDOSCOPY;  Service: Endoscopy;  Laterality:  N/A;   FOOT SURGERY  12/06/2007   GYNECOLOGIC CRYOSURGERY  12/06/1991   WISDOM TOOTH EXTRACTION  12/05/2009    Family History  Problem Relation Age of Onset   Other Mother        brain tumor   Other Maternal Aunt        brain tumor   Other Maternal Grandmother        brain tumor   Cancer Maternal Grandmother    Diabetes Maternal Grandfather    Breast cancer Paternal Grandmother    Hypertension Paternal Grandmother    Cancer Paternal Grandfather        colon    Social History   Socioeconomic History   Marital status: Single    Spouse name: Not on file   Number of children: 3   Years of education: Not on file   Highest education level: Not on file  Occupational History    Comment: part   Tobacco Use   Smoking status: Never   Smokeless tobacco: Never  Vaping Use   Vaping status: Never Used  Substance and Sexual Activity   Alcohol use: Yes    Alcohol/week: 2.0 standard drinks of alcohol    Types: 2 Standard drinks or equivalent per week    Comment: last use 03/13/22 3x/mo   Drug use: No   Sexual activity: Yes    Partners: Male    Birth control/protection: None  Other Topics Concern   Not on file  Social History Narrative      Social Drivers of Health   Financial Resource Strain: Low Risk  (07/09/2024)   Received from [facility]   Overall Financial Resource Strain (CARDIA)    Difficulty of Paying Living Expenses: Not hard at all  Food Insecurity: No Food Insecurity (07/09/2024)   Received from [facility]   Hunger Vital Sign    Within the past 12 months, you worried that your food would run out before you got the money to buy more.: Never true    Within the past 12 months, the food you bought just didn't last and you didn't have money to get more.: Never true  Transportation Needs: No Transportation Needs (07/09/2024)   Received from [facility] - Transportation    In the past 12 months, has lack of  transportation kept you from medical appointments or from getting medications?: No    Lack of Transportation (Non-Medical): No  Physical Activity: Insufficiently Active (08/26/2024)   Exercise Vital Sign    Days of Exercise per Week: 4 days    Minutes of Exercise per Session: 20 min  Stress: No Stress Concern Present (08/11/2023)   [employer] of Occupational Health - Occupational Stress Questionnaire    Feeling of Stress : Only a little  Social Connections: Moderately Isolated (08/26/2024)  Social Connection and Isolation Panel    Frequency of Communication with Friends and Family: More than three times a week    Frequency of Social Gatherings with Friends and Family: More than three times a week    Attends Religious Services: More than 4 times per year    Active Member of [employer] or Organizations: No    Attends Banker Meetings: Never    Marital Status: Never married  Intimate Partner Violence: Not At Risk (08/26/2024)   Humiliation, Afraid, Rape, and Kick questionnaire    Fear of Current or Ex-Partner: No    Emotionally Abused: No    Physically Abused: No    Sexually Abused: No     Current Outpatient Medications:    Apple Cider Vinegar 188 MG CAPS, Take 1 capsule by mouth daily., Disp: , Rfl:    Beta Carotene (VITAMIN A) 25000 UNIT capsule, Take 25,000 Units by mouth daily., Disp: , Rfl:    Cholecalciferol (VITAMIN D ) 50 MCG (2000 UT) CAPS, Take 1 capsule (2,000 Units total) by mouth daily., Disp: 100 capsule, Rfl: 3   clotrimazole  (CLOTRIMAZOLE  ANTI-FUNGAL) 1 % cream, Apply 1 Application topically 2 (two) times daily., Disp: 30 g, Rfl: 0   cyanocobalamin (VITAMIN B12) 1000 MCG tablet, Take 1,000 mcg by mouth daily., Disp: , Rfl:    gabapentin (NEURONTIN) 100 MG capsule, Take 100 mg by mouth 3 (three) times daily., Disp: , Rfl:    Multiple Vitamins-Minerals (WOMENS MULTIVITAMIN PO), Take by mouth., Disp: , Rfl:    rosuvastatin  (CRESTOR ) 20 MG tablet, Take 1 tablet  (20 mg total) by mouth daily., Disp: 90 tablet, Rfl: 1   sertraline  (ZOLOFT ) 50 MG tablet, Take 1 tablet (50 mg total) by mouth daily., Disp: 30 tablet, Rfl: 0   traZODone  (DESYREL ) 50 MG tablet, TAKE 0.5-1 TABLETS BY MOUTH AT BEDTIME AS NEEDED FOR SLEEP., Disp: 90 tablet, Rfl: 0   vitamin E 45 MG (100 UNITS) capsule, Take 100 Units by mouth daily., Disp: , Rfl:   Allergies  Allergen Reactions   Shellfish Allergy Anaphylaxis   Latex Rash and Dermatitis   Doxycycline Nausea Only   Sulfa Antibiotics    Naproxen  Diarrhea, Nausea Only and Other (See Comments)    naproxen    Oxycodone  Nausea And Vomiting, Nausea Only and Other (See Comments)    oxycodone    Tramadol Nausea And Vomiting, Nausea Only and Other (See Comments)    nausea  tramadol     ROS  Constitutional: Negative for fever or weight change.  Respiratory: Negative for cough and shortness of breath.   Cardiovascular: Negative for chest pain or palpitations.  Gastrointestinal: Negative for abdominal pain, no bowel changes.  Musculoskeletal: Negative for gait problem or joint swelling.  Skin: Negative for rash.  Neurological: Negative for dizziness or headache.  No other specific complaints in a complete review of systems (except as listed in HPI above).   Objective  Vitals:   08/26/24 1331  BP: 106/72  Pulse: 78  Resp: 16  SpO2: 98%  Weight: 168 lb 11.2 oz (76.5 kg)  Height: 5' 3 (1.6 m)    Body mass index is 29.88 kg/m.  Physical Exam  Constitutional: Patient appears well-developed and well-nourished. No distress.  HENT: Head: Normocephalic and atraumatic. Ears: B TMs ok, no erythema or effusion; Nose: Nose normal. Mouth/Throat: Oropharynx is clear and moist. No oropharyngeal exudate.  Eyes: Conjunctivae and EOM are normal. Pupils are equal, round, and reactive to light. No scleral icterus.  Neck:  Normal range of motion. Neck supple. No JVD present. No thyromegaly present.  Cardiovascular: Normal rate,  regular rhythm and normal heart sounds.  No murmur heard. No BLE edema. Pulmonary/Chest: Effort normal and breath sounds normal. No respiratory distress. Abdominal: Soft. Bowel sounds are normal, no distension. There is no tenderness. no masses Breast: no lumps or masses, no nipple discharge or rashes FEMALE GENITALIA:  External genitalia normal External urethra normal Vaginal vault normal without discharge or lesions Cervix normal without discharge or lesions Bimanual exam normal without masses RECTAL: not done  Musculoskeletal: Normal range of motion, no joint effusions. No gross deformities Neurological: he is alert and oriented to person, place, and time. No cranial nerve deficit. Coordination, balance, strength, speech and gait are normal.  Skin: Skin is warm and dry. No rash noted. No erythema.  Psychiatric: Patient has a normal mood and affect. behavior is normal. Judgment and thought content normal.     Assessment & Plan  1. Well woman exam (Primary)  - Cytology - PAP - RPR - HIV Antibody (routine testing w rflx)  2. Cervical cancer screening  - Cytology - PAP  3. Perimenopause  She will discuss with gyn    -USPSTF grade A and B recommendations reviewed with patient; age-appropriate recommendations, preventive care, screening tests, etc discussed and encouraged; healthy living encouraged; see AVS for patient education given to patient -Discussed importance of 150 minutes of physical activity weekly, eat two servings of fish weekly, eat one serving of tree nuts ( cashews, pistachios, pecans, almonds.SABRA) every other day, eat 6 servings of fruit/vegetables daily and drink plenty of water and avoid sweet beverages.   -Reviewed Health Maintenance: Yes.

## 2024-08-27 ENCOUNTER — Ambulatory Visit: Payer: Self-pay | Admitting: Family Medicine

## 2024-08-27 ENCOUNTER — Encounter: Payer: Self-pay | Admitting: Family Medicine

## 2024-08-27 LAB — COMPREHENSIVE METABOLIC PANEL WITH GFR
ALT: 17 IU/L (ref 0–32)
AST: 21 IU/L (ref 0–40)
Albumin: 4.1 g/dL (ref 3.9–4.9)
Alkaline Phosphatase: 66 IU/L (ref 41–116)
BUN/Creatinine Ratio: 18 (ref 9–23)
BUN: 13 mg/dL (ref 6–24)
Bilirubin Total: 0.3 mg/dL (ref 0.0–1.2)
CO2: 24 mmol/L (ref 20–29)
Calcium: 9.1 mg/dL (ref 8.7–10.2)
Chloride: 107 mmol/L — ABNORMAL HIGH (ref 96–106)
Creatinine, Ser: 0.72 mg/dL (ref 0.57–1.00)
Globulin, Total: 2.6 g/dL (ref 1.5–4.5)
Glucose: 85 mg/dL (ref 70–99)
Potassium: 4.3 mmol/L (ref 3.5–5.2)
Sodium: 142 mmol/L (ref 134–144)
Total Protein: 6.7 g/dL (ref 6.0–8.5)
eGFR: 102 mL/min/1.73 (ref >59)

## 2024-08-27 LAB — CBC WITH DIFFERENTIAL/PLATELET
Basophils Absolute: 0 x10E3/uL (ref 0.0–0.2)
Basos: 0 %
EOS (ABSOLUTE): 0.1 x10E3/uL (ref 0.0–0.4)
Eos: 1 %
Hematocrit: 37.9 % (ref 34.0–46.6)
Hemoglobin: 12.4 g/dL (ref 11.1–15.9)
Immature Grans (Abs): 0 x10E3/uL (ref 0.0–0.1)
Immature Granulocytes: 0 %
Lymphocytes Absolute: 2.1 x10E3/uL (ref 0.7–3.1)
Lymphs: 43 %
MCH: 31 pg (ref 26.6–33.0)
MCHC: 32.7 g/dL (ref 31.5–35.7)
MCV: 95 fL (ref 79–97)
Monocytes Absolute: 0.4 x10E3/uL (ref 0.1–0.9)
Monocytes: 9 %
Neutrophils Absolute: 2.3 x10E3/uL (ref 1.4–7.0)
Neutrophils: 47 %
Platelets: 205 x10E3/uL (ref 150–450)
RBC: 4 x10E6/uL (ref 3.77–5.28)
RDW: 12.9 % (ref 11.7–15.4)
WBC: 4.9 x10E3/uL (ref 3.4–10.8)

## 2024-08-27 LAB — LIPID PANEL
Chol/HDL Ratio: 3.6 ratio (ref 0.0–4.4)
Cholesterol, Total: 254 mg/dL — ABNORMAL HIGH (ref 100–199)
HDL: 71 mg/dL (ref >39)
LDL Chol Calc (NIH): 176 mg/dL — ABNORMAL HIGH (ref 0–99)
Triglycerides: 45 mg/dL (ref 0–149)
VLDL Cholesterol Cal: 7 mg/dL (ref 5–40)

## 2024-08-27 LAB — HIV ANTIBODY (ROUTINE TESTING W REFLEX): HIV Screen 4th Generation wRfx: NONREACTIVE

## 2024-08-27 LAB — VITAMIN D 25 HYDROXY (VIT D DEFICIENCY, FRACTURES): Vit D, 25-Hydroxy: 25.4 ng/mL — ABNORMAL LOW (ref 30.0–100.0)

## 2024-08-27 LAB — RPR: RPR Ser Ql: NONREACTIVE

## 2024-08-28 ENCOUNTER — Ambulatory Visit: Payer: Self-pay | Admitting: Family Medicine

## 2024-08-28 ENCOUNTER — Other Ambulatory Visit: Payer: Self-pay | Admitting: Family Medicine

## 2024-08-28 DIAGNOSIS — E7800 Pure hypercholesterolemia, unspecified: Secondary | ICD-10-CM

## 2024-08-28 LAB — CYTOLOGY - PAP
Adequacy: ABSENT
Chlamydia: NEGATIVE
Comment: NEGATIVE
Comment: NEGATIVE
Comment: NEGATIVE
Comment: NORMAL
Diagnosis: NEGATIVE
High risk HPV: NEGATIVE
Neisseria Gonorrhea: NEGATIVE
Trichomonas: NEGATIVE

## 2024-08-28 MED ORDER — ROSUVASTATIN CALCIUM 20 MG PO TABS: 20.0000 mg | ORAL_TABLET | Freq: Every day | ORAL | 1 refills | Status: AC

## 2024-10-10 ENCOUNTER — Encounter: Payer: Self-pay | Admitting: Family Medicine

## 2024-10-14 ENCOUNTER — Ambulatory Visit: Admitting: Family Medicine

## 2024-12-26 ENCOUNTER — Ambulatory Visit: Admitting: Family Medicine

## 2025-01-08 ENCOUNTER — Telehealth

## 2025-01-08 DIAGNOSIS — B9689 Other specified bacterial agents as the cause of diseases classified elsewhere: Secondary | ICD-10-CM

## 2025-01-09 MED ORDER — METRONIDAZOLE 500 MG PO TABS: 500.0000 mg | ORAL_TABLET | Freq: Two times a day (BID) | ORAL | 0 refills | Status: AC

## 2025-01-09 NOTE — Progress Notes (Signed)

## 2025-02-21 ENCOUNTER — Ambulatory Visit: Admitting: Family Medicine

## 2025-08-27 ENCOUNTER — Encounter: Admitting: Family Medicine
